# Patient Record
Sex: Female | Born: 1965 | Hispanic: Yes | Smoking: Never smoker
Health system: Southern US, Community
[De-identification: ages and names within clinical notes are randomized; demographics above are authoritative.]

## PROBLEM LIST (undated history)

## (undated) DIAGNOSIS — E119 Type 2 diabetes mellitus without complications: Secondary | ICD-10-CM

## (undated) HISTORY — PX: KNEE SURGERY: SHX244

---

## 2020-07-23 ENCOUNTER — Encounter (HOSPITAL_COMMUNITY): Payer: Self-pay | Admitting: Emergency Medicine

## 2020-07-23 ENCOUNTER — Inpatient Hospital Stay (HOSPITAL_COMMUNITY)
Admission: EM | Admit: 2020-07-23 | Discharge: 2020-07-27 | DRG: 309 | Disposition: A | Discharge status: Home / Self Care | Payer: Self-pay | Attending: Family Medicine | Admitting: Family Medicine

## 2020-07-23 ENCOUNTER — Other Ambulatory Visit: Payer: Self-pay

## 2020-07-23 ENCOUNTER — Emergency Department (HOSPITAL_COMMUNITY): Payer: Self-pay

## 2020-07-23 DIAGNOSIS — R739 Hyperglycemia, unspecified: Secondary | ICD-10-CM

## 2020-07-23 DIAGNOSIS — R55 Syncope and collapse: Secondary | ICD-10-CM | POA: Diagnosis present

## 2020-07-23 DIAGNOSIS — R9431 Abnormal electrocardiogram [ECG] [EKG]: Secondary | ICD-10-CM | POA: Diagnosis present

## 2020-07-23 DIAGNOSIS — F4321 Adjustment disorder with depressed mood: Secondary | ICD-10-CM

## 2020-07-23 DIAGNOSIS — Y92009 Unspecified place in unspecified non-institutional (private) residence as the place of occurrence of the external cause: Secondary | ICD-10-CM

## 2020-07-23 DIAGNOSIS — R7989 Other specified abnormal findings of blood chemistry: Secondary | ICD-10-CM | POA: Diagnosis present

## 2020-07-23 DIAGNOSIS — R031 Nonspecific low blood-pressure reading: Secondary | ICD-10-CM | POA: Diagnosis present

## 2020-07-23 DIAGNOSIS — E881 Lipodystrophy, not elsewhere classified: Secondary | ICD-10-CM | POA: Diagnosis present

## 2020-07-23 DIAGNOSIS — H538 Other visual disturbances: Secondary | ICD-10-CM | POA: Diagnosis present

## 2020-07-23 DIAGNOSIS — I499 Cardiac arrhythmia, unspecified: Principal | ICD-10-CM | POA: Diagnosis present

## 2020-07-23 DIAGNOSIS — R778 Other specified abnormalities of plasma proteins: Secondary | ICD-10-CM | POA: Diagnosis present

## 2020-07-23 DIAGNOSIS — Z8249 Family history of ischemic heart disease and other diseases of the circulatory system: Secondary | ICD-10-CM

## 2020-07-23 DIAGNOSIS — Z681 Body mass index (BMI) 19 or less, adult: Secondary | ICD-10-CM

## 2020-07-23 DIAGNOSIS — T383X6A Underdosing of insulin and oral hypoglycemic [antidiabetic] drugs, initial encounter: Secondary | ICD-10-CM | POA: Diagnosis present

## 2020-07-23 DIAGNOSIS — E1165 Type 2 diabetes mellitus with hyperglycemia: Secondary | ICD-10-CM | POA: Diagnosis present

## 2020-07-23 DIAGNOSIS — Z7984 Long term (current) use of oral hypoglycemic drugs: Secondary | ICD-10-CM

## 2020-07-23 DIAGNOSIS — Z20822 Contact with and (suspected) exposure to covid-19: Secondary | ICD-10-CM | POA: Diagnosis present

## 2020-07-23 DIAGNOSIS — F4320 Adjustment disorder, unspecified: Secondary | ICD-10-CM | POA: Diagnosis present

## 2020-07-23 DIAGNOSIS — Z634 Disappearance and death of family member: Secondary | ICD-10-CM

## 2020-07-23 DIAGNOSIS — R079 Chest pain, unspecified: Secondary | ICD-10-CM | POA: Diagnosis present

## 2020-07-23 DIAGNOSIS — R64 Cachexia: Secondary | ICD-10-CM | POA: Diagnosis present

## 2020-07-23 DIAGNOSIS — Z9119 Patient's noncompliance with other medical treatment and regimen: Secondary | ICD-10-CM

## 2020-07-23 DIAGNOSIS — R54 Age-related physical debility: Secondary | ICD-10-CM | POA: Diagnosis present

## 2020-07-23 HISTORY — DX: Type 2 diabetes mellitus without complications: E11.9

## 2020-07-23 LAB — I-STAT VENOUS BLOOD GAS, ED
Acid-Base Excess: 1 mmol/L (ref 0.0–2.0)
Bicarbonate: 26.7 mmol/L (ref 20.0–28.0)
Calcium, Ion: 1.13 mmol/L — ABNORMAL LOW (ref 1.15–1.40)
HCT: 44 % (ref 36.0–46.0)
Hemoglobin: 15 g/dL (ref 12.0–15.0)
O2 Saturation: 92 %
Potassium: 3.9 mmol/L (ref 3.5–5.1)
Sodium: 135 mmol/L (ref 135–145)
TCO2: 28 mmol/L (ref 22–32)
pCO2, Ven: 46.4 mmHg (ref 44.0–60.0)
pH, Ven: 7.368 (ref 7.250–7.430)
pO2, Ven: 65 mmHg — ABNORMAL HIGH (ref 32.0–45.0)

## 2020-07-23 LAB — CBC WITH DIFFERENTIAL/PLATELET
Abs Immature Granulocytes: 0.02 10*3/uL (ref 0.00–0.07)
Basophils Absolute: 0.1 10*3/uL (ref 0.0–0.1)
Basophils Relative: 1 %
Eosinophils Absolute: 0.1 10*3/uL (ref 0.0–0.5)
Eosinophils Relative: 1 %
HCT: 43.2 % (ref 36.0–46.0)
Hemoglobin: 15.1 g/dL — ABNORMAL HIGH (ref 12.0–15.0)
Immature Granulocytes: 0 %
Lymphocytes Relative: 26 %
Lymphs Abs: 2 10*3/uL (ref 0.7–4.0)
MCH: 30.1 pg (ref 26.0–34.0)
MCHC: 35 g/dL (ref 30.0–36.0)
MCV: 86.2 fL (ref 80.0–100.0)
Monocytes Absolute: 1 10*3/uL (ref 0.1–1.0)
Monocytes Relative: 13 %
Neutro Abs: 4.6 10*3/uL (ref 1.7–7.7)
Neutrophils Relative %: 59 %
Platelets: 232 10*3/uL (ref 150–400)
RBC: 5.01 MIL/uL (ref 3.87–5.11)
RDW: 11.9 % (ref 11.5–15.5)
WBC: 7.8 10*3/uL (ref 4.0–10.5)
nRBC: 0 % (ref 0.0–0.2)

## 2020-07-23 LAB — COMPREHENSIVE METABOLIC PANEL
ALT: 41 U/L (ref 0–44)
AST: 35 U/L (ref 15–41)
Albumin: 3.5 g/dL (ref 3.5–5.0)
Alkaline Phosphatase: 117 U/L (ref 38–126)
Anion gap: 11 (ref 5–15)
BUN: 14 mg/dL (ref 6–20)
CO2: 24 mmol/L (ref 22–32)
Calcium: 8.7 mg/dL — ABNORMAL LOW (ref 8.9–10.3)
Chloride: 99 mmol/L (ref 98–111)
Creatinine, Ser: 0.57 mg/dL (ref 0.44–1.00)
GFR, Estimated: >60 mL/min (ref >60)
Glucose, Bld: 515 mg/dL (ref 70–99)
Potassium: 3.9 mmol/L (ref 3.5–5.1)
Sodium: 134 mmol/L — ABNORMAL LOW (ref 135–145)
Total Bilirubin: 0.5 mg/dL (ref 0.3–1.2)
Total Protein: 6.7 g/dL (ref 6.5–8.1)

## 2020-07-23 LAB — BETA-HYDROXYBUTYRIC ACID: Beta-Hydroxybutyric Acid: 1.62 mmol/L — ABNORMAL HIGH (ref 0.05–0.27)

## 2020-07-23 LAB — TROPONIN I (HIGH SENSITIVITY): Troponin I (High Sensitivity): 193 ng/L (ref <18)

## 2020-07-23 LAB — BRAIN NATRIURETIC PEPTIDE: B Natriuretic Peptide: 85.1 pg/mL (ref 0.0–100.0)

## 2020-07-23 NOTE — ED Provider Notes (Signed)
Emergency Medicine Provider Triage Evaluation Note  Kimberly Fry , a 55 y.o. female  was evaluated in triage.  Pt complains of syncopal episode today with associated chest pain, shortness of breath, headache, and lightheadedness.  Patient is tearful in triage, states that she had a death in the family today and is feeling quite stressed.  CBG for EMS was 573.  BP elevated with 160/98. Hx of diabetes  Review of Systems  Positive: Chest pain,shortness of breath, headache, lower extremity edema Negative: , Chills, nausea, abdominal pain  Physical Exam  There were no vitals taken for this visit. Gen:   Awake, tearful, cachectic Resp:  Normal effort  MSK:   Moves extremities without difficulty  Other:  RRR no M/R/G.  Lungs CTA B  Medical Decision Making  Medically screening exam initiated at 9:15 PM.  Appropriate orders placed.  Kimberly Fry was informed that the remainder of the evaluation will be completed by another provider, this initial triage assessment does not replace that evaluation, and the importance of remaining in the ED until their evaluation is complete.  This chart was dictated using voice recognition software, Dragon. Despite the best efforts of this provider to proofread and correct errors, errors may still occur which can change documentation meaning.    Sherrilee Gilles 07/23/20 2117    Pollyann Savoy, MD 07/23/20 2248

## 2020-07-23 NOTE — ED Triage Notes (Signed)
Pt brought to ED by [facility] from home for c/o syncope episode, HA and dizziness pt had a recent death on her family and going through a lot of stress, CBG for ems 573 pta. Bp 160/98, HR 80, R 20, SPO2 97% RA.

## 2020-07-24 ENCOUNTER — Other Ambulatory Visit: Payer: Self-pay

## 2020-07-24 ENCOUNTER — Encounter (HOSPITAL_COMMUNITY): Payer: Self-pay | Admitting: Internal Medicine

## 2020-07-24 ENCOUNTER — Observation Stay (HOSPITAL_BASED_OUTPATIENT_CLINIC_OR_DEPARTMENT_OTHER): Payer: Self-pay

## 2020-07-24 ENCOUNTER — Observation Stay (HOSPITAL_COMMUNITY): Payer: Self-pay

## 2020-07-24 DIAGNOSIS — R079 Chest pain, unspecified: Secondary | ICD-10-CM

## 2020-07-24 DIAGNOSIS — E1165 Type 2 diabetes mellitus with hyperglycemia: Secondary | ICD-10-CM | POA: Diagnosis present

## 2020-07-24 DIAGNOSIS — R911 Solitary pulmonary nodule: Secondary | ICD-10-CM

## 2020-07-24 DIAGNOSIS — R55 Syncope and collapse: Secondary | ICD-10-CM | POA: Diagnosis present

## 2020-07-24 DIAGNOSIS — R778 Other specified abnormalities of plasma proteins: Secondary | ICD-10-CM | POA: Diagnosis present

## 2020-07-24 LAB — LIPID PANEL
Cholesterol: 180 mg/dL (ref 0–200)
HDL: 43 mg/dL (ref >40)
LDL Cholesterol: 112 mg/dL — ABNORMAL HIGH (ref 0–99)
Total CHOL/HDL Ratio: 4.2 RATIO
Triglycerides: 125 mg/dL (ref <150)
VLDL: 25 mg/dL (ref 0–40)

## 2020-07-24 LAB — CBG MONITORING, ED
Glucose-Capillary: 413 mg/dL — ABNORMAL HIGH (ref 70–99)
Glucose-Capillary: 332 mg/dL — ABNORMAL HIGH (ref 70–99)
Glucose-Capillary: 362 mg/dL — ABNORMAL HIGH (ref 70–99)
Glucose-Capillary: 323 mg/dL — ABNORMAL HIGH (ref 70–99)
Glucose-Capillary: 289 mg/dL — ABNORMAL HIGH (ref 70–99)
Glucose-Capillary: 189 mg/dL — ABNORMAL HIGH (ref 70–99)

## 2020-07-24 LAB — HIV ANTIBODY (ROUTINE TESTING W REFLEX): HIV Screen 4th Generation wRfx: NONREACTIVE

## 2020-07-24 LAB — ECHOCARDIOGRAM COMPLETE
Area-P 1/2: 3.48 cm2
Height: 64 in
S' Lateral: 2.5 cm
Weight: 1440 oz

## 2020-07-24 LAB — BLOOD GAS, VENOUS
Acid-Base Excess: 2.6 mmol/L — ABNORMAL HIGH (ref 0.0–2.0)
Bicarbonate: 26.6 mmol/L (ref 20.0–28.0)
FIO2: 21
O2 Saturation: 71 %
Patient temperature: 36.5
pCO2, Ven: 39.8 mmHg — ABNORMAL LOW (ref 44.0–60.0)
pH, Ven: 7.438 — ABNORMAL HIGH (ref 7.250–7.430)
pO2, Ven: 34.5 mmHg (ref 32.0–45.0)

## 2020-07-24 LAB — SARS CORONAVIRUS 2 (TAT 6-24 HRS): SARS Coronavirus 2: NEGATIVE

## 2020-07-24 LAB — TROPONIN I (HIGH SENSITIVITY)
Troponin I (High Sensitivity): 141 ng/L (ref <18)
Troponin I (High Sensitivity): 65 ng/L — ABNORMAL HIGH (ref <18)

## 2020-07-24 LAB — OSMOLALITY: Osmolality: 301 mOsm/kg — ABNORMAL HIGH (ref 275–295)

## 2020-07-24 LAB — GLUCOSE, CAPILLARY
Glucose-Capillary: 354 mg/dL — ABNORMAL HIGH (ref 70–99)
Glucose-Capillary: 380 mg/dL — ABNORMAL HIGH (ref 70–99)

## 2020-07-24 MED ORDER — ONDANSETRON HCL 4 MG/2ML IJ SOLN: 4.0000 mg | Freq: Four times a day (QID) | INTRAMUSCULAR | Status: DC | PRN

## 2020-07-24 MED ORDER — ACETAMINOPHEN 325 MG PO TABS
650.0000 mg | ORAL_TABLET | ORAL | Status: DC | PRN
  Administered 2020-07-25 – 2020-07-26 (×3): 650 mg via ORAL
  Filled 2020-07-24 (×4): qty 2

## 2020-07-24 MED ORDER — ATORVASTATIN CALCIUM 40 MG PO TABS
40.0000 mg | ORAL_TABLET | Freq: Every day | ORAL | Status: DC
  Administered 2020-07-24 – 2020-07-27 (×4): 40 mg via ORAL
  Filled 2020-07-24 (×4): qty 1

## 2020-07-24 MED ORDER — POLYETHYLENE GLYCOL 3350 17 G PO PACK: 17.0000 g | PACK | Freq: Every day | ORAL | Status: DC | PRN

## 2020-07-24 MED ORDER — INSULIN GLARGINE 100 UNIT/ML ~~LOC~~ SOLN
10.0000 [IU] | Freq: Every day | SUBCUTANEOUS | Status: DC
  Administered 2020-07-24: 10 [IU] via SUBCUTANEOUS
  Filled 2020-07-24 (×2): qty 0.1

## 2020-07-24 MED ORDER — ASPIRIN 325 MG PO TABS
325.0000 mg | ORAL_TABLET | Freq: Every day | ORAL | Status: DC
  Administered 2020-07-24 – 2020-07-27 (×4): 325 mg via ORAL
  Filled 2020-07-24 (×4): qty 1

## 2020-07-24 MED ORDER — ENOXAPARIN SODIUM 40 MG/0.4ML IJ SOSY
40.0000 mg | PREFILLED_SYRINGE | INTRAMUSCULAR | Status: DC
  Administered 2020-07-24 – 2020-07-27 (×4): 40 mg via SUBCUTANEOUS
  Filled 2020-07-24 (×4): qty 0.4

## 2020-07-24 MED ORDER — NITROGLYCERIN 0.4 MG SL SUBL: 0.4000 mg | SUBLINGUAL_TABLET | SUBLINGUAL | Status: DC | PRN

## 2020-07-24 MED ORDER — INSULIN ASPART 100 UNIT/ML IJ SOLN
0.0000 [IU] | Freq: Three times a day (TID) | INTRAMUSCULAR | Status: DC
  Administered 2020-07-24: 11 [IU] via SUBCUTANEOUS
  Administered 2020-07-24 (×2): 15 [IU] via SUBCUTANEOUS
  Administered 2020-07-24: 3 [IU] via SUBCUTANEOUS
  Administered 2020-07-25: 11 [IU] via SUBCUTANEOUS
  Administered 2020-07-25: 15 [IU] via SUBCUTANEOUS
  Administered 2020-07-26: 11 [IU] via SUBCUTANEOUS
  Administered 2020-07-26: 3 [IU] via SUBCUTANEOUS
  Administered 2020-07-26: 8 [IU] via SUBCUTANEOUS
  Administered 2020-07-26 – 2020-07-27 (×2): 11 [IU] via SUBCUTANEOUS

## 2020-07-24 MED ORDER — IOHEXOL 350 MG/ML SOLN
75.0000 mL | Freq: Once | INTRAVENOUS | Status: AC | PRN
  Administered 2020-07-24: 75 mL via INTRAVENOUS

## 2020-07-24 MED ORDER — INSULIN ASPART 100 UNIT/ML IJ SOLN
3.0000 [IU] | Freq: Three times a day (TID) | INTRAMUSCULAR | Status: DC
  Administered 2020-07-24 – 2020-07-27 (×5): 3 [IU] via SUBCUTANEOUS

## 2020-07-24 MED ORDER — SODIUM CHLORIDE 0.9 % IV BOLUS
1000.0000 mL | Freq: Once | INTRAVENOUS | Status: AC
  Administered 2020-07-24: 1000 mL via INTRAVENOUS

## 2020-07-24 NOTE — Progress Notes (Signed)
Inpatient Diabetes Program Recommendations  AACE/ADA: New Consensus Statement on Inpatient Glycemic Control (2015)  Target Ranges:  Prepandial:   less than 140 mg/dL      Peak postprandial:   less than 180 mg/dL (1-2 hours)      Critically ill patients:  140 - 180 mg/dL   Lab Results  Component Value Date   GLUCAP 189 (H) 07/24/2020    Review of Glycemic Control Results for KAISLYN, GULAS (MRN [id number]) as of 07/24/2020 13:30  Ref. Range 07/24/2020 06:19 07/24/2020 07:45 07/24/2020 09:02 07/24/2020 10:28 07/24/2020 11:46  Glucose-Capillary Latest Ref Range: 70 - 99 mg/dL 416 (H) 384 (H) 536 (H) 289 (H) 189 (H)   Diabetes history: DM 2 Outpatient Diabetes medications: Metformin 500 mg bid for 1 year then stopped due to side effects Current orders for Inpatient glycemic control:  Lantus 10 units qhs Novolog 0-15 units tid  Novolog 3 units tid meal coverage  No PCP No insurance  Inpatient Diabetes Program Recommendations:    Spoke with pt at bedside via interpreter Alvino Chapel [phone]). RN staff at bedside placing pt on monitor and assessing pt. Pt reports having a doctor at one time but no longer has one that is affordable. Pt does not work but lives with her daughter. Pt has a meter at home and when she checked her glucose she would be in the 370-400 range. Pt stopped taking metformin after 1 year due to side effects and it was also not effective in lowering her glucose levels. Discussed that pt may need to be on insulin to achieve glucose goals. Lab came to draw blood, pt to order food. Left pt with nursing staff.  Will see pt again on 6/23 to show insulin, possibly 70/30 at time of d/c. Pt has blurry vision currently would benefit from insulin pen at time of d/c. Will follow glucose trends.  TOC consult for PCP and medication assistance needs clinic follow up  Thanks,  Christena Deem RN, MSN, BC-ADM Inpatient Diabetes Coordinator Team Pager [phone] (8a-5p)

## 2020-07-24 NOTE — Consult Note (Signed)
Cardiology Consultation:   Patient ID: Kimberly Fry MRN: [id number]; DOB: [birth year]  Admit date: 07/23/2020 Date of Consult: 07/24/2020  Primary Care Provider: Pcp, No [facility] Cardiologist: None  [facility] Electrophysiologist:  None    Patient Profile:   Kimberly Fry is a 55 y.o. female with a hx of DM2 and medical noncompliance who is being seen today for the evaluation of syncope at the request of Dr. Mahala Menghini.  History of Present Illness:   Ms. Kimberly Fry is a 55yo Spanish speaking female with a hx of DM2 and medical noncompliance.  Medical hx was obtained directly from patient using a translator.  The patient was in her [facility] until yesterday evening when she found out that her mother had died that was sudden after she was thought to be recovering from a respiratory illness.  The patient was devastated and shortly thereafter developed sudden onset of chest pain midsternal pressure with no radiation but associated with SOB but no nausea or diaphoresis. The CP lasted for several hours and suddenly has a witnessed syncopal event by her daughter with no seizure-like activity, no tongue biting or incontinence and quickly regained consciousness.   She was taken to [facility] by EMS where her BS was 573 and BP 160/36mmHg.  Initial hs Trop normal but second one was elevated at 193 and then 141.  Cxray with NAD but a RUL nodule noted and Chest CTA showed a punctate nodule in the RUL with b/l subsegmental atelectasis and no PE or coronary artery calcifications.  EKG showed TWI in V1 and V2 with no prior EKG to compare to. 2D echo is pending.  Cardiology is now asked to consult for workup of CP and syncope.   Past Medical History:  Diagnosis Date   Diabetes mellitus without complication (HCC)     History reviewed. No pertinent surgical history.   Home Medications:  Prior to Admission medications   Not on File    Inpatient Medications: Scheduled Meds:  aspirin   325 mg Oral Daily   atorvastatin  40 mg Oral Daily   enoxaparin (LOVENOX) injection  40 mg Subcutaneous Q24H   insulin aspart  0-15 Units Subcutaneous TID AC & HS   insulin aspart  3 Units Subcutaneous TID WC   insulin glargine  10 Units Subcutaneous QHS   Continuous Infusions:  PRN Meds: acetaminophen, nitroGLYCERIN, ondansetron (ZOFRAN) IV, polyethylene glycol  Allergies:   No Known Allergies  Social History:   Social History   Socioeconomic History   Marital status: Unknown    Spouse name: Not on file   Number of children: Not on file   Years of education: Not on file   Highest education level: Not on file  Occupational History   Not on file  Tobacco Use   Smoking status: Never   Smokeless tobacco: Never  Substance and Sexual Activity   Alcohol use: Never   Drug use: Never   Sexual activity: Never  Other Topics Concern   Not on file  Social History Narrative   Not on file   Social Determinants of Health   Financial Resource Strain: Not on file  Food Insecurity: Not on file  Transportation Needs: Not on file  Physical Activity: Not on file  Stress: Not on file  Social Connections: Not on file  Intimate Partner Violence: Not on file    Family History:    Family History  Problem Relation Age of Onset   Heart disease Father  ROS:  Please see the history of present illness.   All other ROS reviewed and negative.     Physical Exam/Data:   Vitals:   07/24/20 0800 07/24/20 0830 07/24/20 0900 07/24/20 0900  BP: 99/67 93/66 96/78    Pulse: 64 63 63   Resp: 14 13 15    Temp:      TempSrc:      SpO2: 97% 97% 97%   Weight:    40.8 kg  Height:    5\' 4"  (1.626 m)    Intake/Output Summary (Last 24 hours) at 07/24/2020 1106 Last data filed at 07/24/2020 0200 Gross per 24 hour  Intake 1000 ml  Output --  Net 1000 ml   Last 3 Weights 07/24/2020  Weight (lbs) 90 lb  Weight (kg) 40.824 kg     Body mass index is 15.45 kg/m.  General:  Well nourished,  well developed, in no acute distress HEENT: normal Lymph: no adenopathy Neck: no JVD Endocrine:  No thryomegaly Vascular: No carotid bruits; FA pulses 2+ bilaterally without bruits  Cardiac:  normal S1, S2; RRR; no murmur  Lungs:  clear to auscultation bilaterally, no wheezing, rhonchi or rales  Abd: soft, nontender, no hepatomegaly  Ext: no edema Musculoskeletal:  No deformities, BUE and BLE strength normal and equal Skin: warm and dry  Neuro:  CNs 2-12 intact, no focal abnormalities noted Psych:  Normal affect   EKG:  The EKG was personally reviewed and demonstrates:  NSR with TWI in V1 and V2 Telemetry:  Telemetry was personally reviewed and demonstrates:  NSR  Relevant CV Studies: none  Laboratory Data:  High Sensitivity Troponin:   Recent Labs  Lab 07/23/20 2133 07/24/20 0001 07/24/20 0300  TROPONINIHS 193* 141* 65*     Chemistry Recent Labs  Lab 07/23/20 2133 07/23/20 2338  NA 134* 135  K 3.9 3.9  CL 99  --   CO2 24  --   GLUCOSE 515*  --   BUN 14  --   CREATININE 0.57  --   CALCIUM 8.7*  --   GFRNONAA >60  --   ANIONGAP 11  --     Recent Labs  Lab 07/23/20 2133  PROT 6.7  ALBUMIN 3.5  AST 35  ALT 41  ALKPHOS 117  BILITOT 0.5   Hematology Recent Labs  Lab 07/23/20 2133 07/23/20 2338  WBC 7.8  --   RBC 5.01  --   HGB 15.1* 15.0  HCT 43.2 44.0  MCV 86.2  --   MCH 30.1  --   MCHC 35.0  --   RDW 11.9  --   PLT 232  --    BNP Recent Labs  Lab 07/23/20 2133  BNP 85.1    DDimer No results for input(s): DDIMER in the last 168 hours.   Radiology/Studies:  DG Chest 2 View  Result Date: 07/23/2020 CLINICAL DATA:  Syncope. EXAM: CHEST - 2 VIEW COMPARISON:  None. FINDINGS: The heart size and mediastinal contours are within normal limits. Vague nodular density within the right upper lobe measures approximately 1.5 cm and is indeterminate. No pleural effusion, pulmonary edema or airspace consolidation. The visualized skeletal structures are  unremarkable. IMPRESSION: 1. No acute cardiopulmonary abnormalities. 2. Indeterminate nodular density within the right upper lobe. Consider further evaluation with nonemergent CT of the chest. Electronically Signed   By: 07/25/20 M.D.   On: 07/23/2020 21:54   CT Angio Chest PE W and/or Wo Contrast  Result Date: 07/24/2020 CLINICAL DATA:  Pulmonary embolus suspected.  Syncope. EXAM: CT ANGIOGRAPHY CHEST WITH CONTRAST TECHNIQUE: Multidetector CT imaging of the chest was performed using the standard protocol during bolus administration of intravenous contrast. Multiplanar CT image reconstructions and MIPs were obtained to evaluate the vascular anatomy. CONTRAST:  106mL OMNIPAQUE IOHEXOL 350 MG/ML SOLN COMPARISON:  Chest x-ray 07/23/2020 FINDINGS: Cardiovascular: Satisfactory opacification of the pulmonary arteries to the segmental level. No evidence of pulmonary embolism. The main pulmonary artery is normal in caliber. Normal heart size. No significant pericardial effusion. The thoracic aorta is normal in caliber. Trace atherosclerotic plaque of the thoracic aorta. No coronary artery calcifications. Mediastinum/Nodes: No enlarged mediastinal, hilar, or axillary lymph nodes. Thyroid gland, trachea, and esophagus demonstrate no significant findings. Lungs/Pleura: Bilateral lower lobe subsegmental atelectasis. No focal consolidation. Punctate calcified nodule within the left lower lobe. Punctate calcified nodule within the right upper lobe. No pulmonary mass. No pleural effusion. No pneumothorax. Upper Abdomen: No acute abnormality. Musculoskeletal: No chest wall abnormality. No suspicious lytic or blastic osseous lesions. No acute displaced fracture. Multilevel degenerative changes of the spine. Review of the MIP images confirms the above findings. IMPRESSION: 1. No pulmonary embolus. 2. No acute intrathoracic abnormality. Electronically Signed   By: Tish Frederickson M.D.   On: 07/24/2020 06:22     Assessment  and Plan:   Chest pain -this occurred in the setting of learning that her mother had suddenly died -hsTrop mildly elevated at 193>141>65 -no PE or coronary calcifications on Chest CTA -she does have CRFs including DM and fm hx of CAD -her BS was markedly elevated on admission and BP elevated so Trop bump may be related to demand ischemia vs. Stress MI from broken heart syndrome after learning of death of her mother -2D echo pending -coronary CTA in am to rule out CAD  2.  Syncope -? Vasovagal after receiving devastating news vs. orthostatic -orthostatic BPs done with no significant drop from baseline but her baseline Bp is low  -BS was markedly elevated so may have some mild volume depletion -will need 30 day event monitor -2D echo pending -coronary CTA to rule out CAD tomorrow     TIMI Risk Score for Unstable Angina or Non-ST Elevation MI:   The patient's TIMI risk score is 3, which indicates a 13% risk of all cause mortality, new or recurrent myocardial infarction or need for urgent revascularization in the next 14 days.       For questions or updates, please contact [facility] Please consult [email or web address] for contact info under    Signed, Armanda Magic, MD  07/24/2020 11:06 AM

## 2020-07-24 NOTE — Progress Notes (Signed)
I agree with the plan of care as per Dr. Leafy Half  Appreciate cardiology input I went to see the patient but she is fast asleep despite me trying to wake her up x2  Follow echo CTA planned as per cardiology in a.m. Will need to discuss with patient/family glycemic control in the a.m.-agree may need 70/30 insulin and or either SGL PT inhibitor or retrial of metformin/trial of another agent for diabetes control  No charge  Pleas Koch, MD [facility] 3:32 PM

## 2020-07-24 NOTE — ED Provider Notes (Signed)
[facility] EMERGENCY DEPARTMENT Provider Note   CSN: [id number] Arrival date & time: 07/23/20  2118     History Chief Complaint  Patient presents with   Loss of Consciousness   Headache    Kimberly Fry is a 55 y.o. female.  HPI     This a 55 year old female with a history of diabetes who presents with a syncopal episode.  Patient reports that she was informed of her mother's death earlier today.  She subsequently passed out.  She states that she feels bad.  She is complaining now mostly of headache, eye soreness, and chest discomfort.  She denies any shortness of breath.  She is never passed out before.  She was noted to be hyperglycemic by EMS.  She reports that her blood sugars are not generally high.  She does not take anything for her diabetes.  She denies any dizziness at this time.  No recent illnesses or fevers.  Past Medical History:  Diagnosis Date   Diabetes mellitus without complication (HCC)     There are no problems to display for this patient.   History reviewed. No pertinent surgical history.   OB History   No obstetric history on file.     History reviewed. No pertinent family history.  Social History   Tobacco Use   Smoking status: Never   Smokeless tobacco: Never  Substance Use Topics   Alcohol use: Never   Drug use: Never    Home Medications Prior to Admission medications   Not on File    Allergies    Patient has no known allergies.  Review of Systems   Review of Systems  Constitutional:  Negative for fever.  Eyes:  Positive for redness.  Respiratory:  Negative for shortness of breath.   Cardiovascular:  Positive for chest pain.  Gastrointestinal:  Negative for abdominal pain, nausea and vomiting.  Neurological:  Positive for syncope and headaches.  All other systems reviewed and are negative.  Physical Exam Updated Vital Signs BP 94/74   Pulse 65   Temp 98.2 F (36.8 C) (Oral)   Resp 16   SpO2  96%   Physical Exam Vitals and nursing note reviewed.  Constitutional:      Appearance: She is well-developed.     Comments: Appears older than stated age but nontoxic  HENT:     Head: Normocephalic and atraumatic.     Mouth/Throat:     Mouth: Mucous membranes are moist.  Eyes:     Pupils: Pupils are equal, round, and reactive to light.     Comments: Bilateral conjunctiva injected  Cardiovascular:     Rate and Rhythm: Normal rate and regular rhythm.     Heart sounds: Normal heart sounds.  Pulmonary:     Effort: Pulmonary effort is normal. No respiratory distress.     Breath sounds: No wheezing.  Abdominal:     General: Bowel sounds are normal.     Palpations: Abdomen is soft.  Musculoskeletal:     Cervical back: Neck supple.  Skin:    General: Skin is warm and dry.  Neurological:     Mental Status: She is alert and oriented to person, place, and time.     Comments: Cranial nerves II through XII intact, 5 out of 5 strength in all 4 extremities, no dysmetria to finger-nose-finger  Psychiatric:     Comments: Tearful    ED Results / Procedures / Treatments   Labs (all labs ordered  are listed, but only abnormal results are displayed) Labs Reviewed  COMPREHENSIVE METABOLIC PANEL - Abnormal; Notable for the following components:      Result Value   Sodium 134 (*)    Glucose, Bld 515 (*)    Calcium 8.7 (*)    All other components within normal limits  CBC WITH DIFFERENTIAL/PLATELET - Abnormal; Notable for the following components:   Hemoglobin 15.1 (*)    All other components within normal limits  BETA-HYDROXYBUTYRIC ACID - Abnormal; Notable for the following components:   Beta-Hydroxybutyric Acid 1.62 (*)    All other components within normal limits  I-STAT VENOUS BLOOD GAS, ED - Abnormal; Notable for the following components:   pO2, Ven 65.0 (*)    Calcium, Ion 1.13 (*)    All other components within normal limits  CBG MONITORING, ED - Abnormal; Notable for the  following components:   Glucose-Capillary 413 (*)    All other components within normal limits  TROPONIN I (HIGH SENSITIVITY) - Abnormal; Notable for the following components:   Troponin I (High Sensitivity) 193 (*)    All other components within normal limits  TROPONIN I (HIGH SENSITIVITY) - Abnormal; Notable for the following components:   Troponin I (High Sensitivity) 141 (*)    All other components within normal limits  BRAIN NATRIURETIC PEPTIDE  BLOOD GAS, VENOUS    EKG EKG Interpretation  Date/Time:  Wednesday July 24 2020 01:03:56 EDT Ventricular Rate:  63 PR Interval:  135 QRS Duration: 85 QT Interval:  484 QTC Calculation: 496 R Axis:   63 Text Interpretation: Sinus rhythm Borderline prolonged QT interval Confirmed by Ross Marcus ([id number]) on 07/24/2020 1:35:10 AM  Radiology DG Chest 2 View  Result Date: 07/23/2020 CLINICAL DATA:  Syncope. EXAM: CHEST - 2 VIEW COMPARISON:  None. FINDINGS: The heart size and mediastinal contours are within normal limits. Vague nodular density within the right upper lobe measures approximately 1.5 cm and is indeterminate. No pleural effusion, pulmonary edema or airspace consolidation. The visualized skeletal structures are unremarkable. IMPRESSION: 1. No acute cardiopulmonary abnormalities. 2. Indeterminate nodular density within the right upper lobe. Consider further evaluation with nonemergent CT of the chest. Electronically Signed   By: Signa Kell M.D.   On: 07/23/2020 21:54    Procedures Procedures   Medications Ordered in ED Medications  sodium chloride 0.9 % bolus 1,000 mL (1,000 mLs Intravenous New Bag/Given 07/24/20 0107)    ED Course  I have reviewed the triage vital signs and the nursing notes.  Pertinent labs & imaging results that were available during my care of the patient were reviewed by me and considered in my medical decision making (see chart for details).    MDM Rules/Calculators/A&P                           Patient presents with an episode of syncope after finding out that her mother had died.  She reports headache and eye burning right now.  She states when she had the syncopal episode she did have some chest pain or shortness of breath.  She has a history of diabetes which she states she does not take anything for.  She is notably hyperglycemic without evidence of DKA.  She is otherwise nontoxic and vital signs are reassuring.  She is afebrile.  Patient was given fluids.  Labs obtained.  Again no evidence of DKA.  EKG shows no evidence of acute ischemia or arrhythmia.  Chest  x-ray shows an indeterminate nodular density in the right upper lobe.  For this reason, will obtain CT scan.  We will also her stratify for PE.  Patient does have a slightly elevated troponin that is downtrending on second blood draw.  Question Takotsubo's given that her syncopal episode and chest pain occurred in the setting of finding out that her mother died.  Chest x-ray shows a nodule.  Given this, will CT to both evaluate for PE and better characterize nodule.  Plan for admission to the hospitalist.   Final Clinical Impression(s) / ED Diagnoses Final diagnoses:  Syncope, unspecified syncope type  Elevated troponin  Hyperglycemia  Grief reaction    Rx / DC Orders ED Discharge Orders     None        Shon Baton, MD 07/24/20 [phone]

## 2020-07-24 NOTE — Progress Notes (Signed)
  Echocardiogram 2D Echocardiogram has been performed.  Kimberly Fry 07/24/2020, 11:20 AM

## 2020-07-24 NOTE — H&P (Signed)
History and Physical    [address] Kimberly Fry MBT:[id number] DOB: 1965-11-13 DOA: 07/23/2020  PCP: Pcp, No  Patient coming from: Home via EMS   Chief Complaint:  Chief Complaint  Patient presents with   Loss of Consciousness   Headache     HPI:    55 year old Spanish-speaking female with past medical history of diabetes mellitus type 2, medication noncompliance who presents to [facility] emergency department via EMS due to complaints of chest pain and loss of consciousness.  History has been obtained directly from patient using a Nurse, learning disability.  Patient explains that yesterday evening she had learned that her mother had just died.  It seems that her mother was suffering from a respiratory illness and for short while seem that she was improving however she was just contacted via phone and told that her mother had worsened and died yesterday evening.  This was unexpected and quite devastating for the patient.  Patient states that shortly after learning that she began to experience chest discomfort.  Patient describes the chest discomfort as midsternal in location, pressure-like in quality and associated with shortness of breath.  Patient is unable to associate her chest discomfort with exertion.  Patient denies any associated cough.  Patient denies any associated diaphoresis.  Patient is chest discomfort persisted throughout the evening and shortly after her daughter arrived home she suddenly experienced an episode of witnessed loss of consciousness.  No seizure-like activity was observed.  Patient denies urinating or defecating herself or biting her tongue.  Patient quickly regained consciousness and EMS was contacted.  EMS promptly arrived on the scene and noted the patient had a markedly elevated blood sugar of 573 and BP of 160/98.  Patient was then brought into [facility] emergency department for evaluation.  Upon evaluation in the emergency department, initial EKG  was unremarkable but troponin was found to be somewhat elevated at 193 followed by a repeat troponin of 141.  Patient was chest pain-free in the emergency department.  Chest x-ray revealed no evidence of acute cardiopulmonary disease but did identify a right upper lobe pulmonary nodule after which the emergency department provider ordered a CT angiogram of the chest.  The hospitalist group was then called to assess the patient for admission to the hospital.  Review of Systems:   Review of Systems  Respiratory:  Positive for shortness of breath.   Cardiovascular:  Positive for chest pain.  Neurological:  Positive for loss of consciousness.  All other systems reviewed and are negative.  Past Medical History:  Diagnosis Date   Diabetes mellitus without complication (HCC)     History reviewed. No pertinent surgical history.   reports that she has never smoked. She has never used smokeless tobacco. She reports that she does not drink alcohol and does not use drugs.  No Known Allergies  Family History  Problem Relation Age of Onset   Heart disease Father      Prior to Admission medications   Not on File    Physical Exam: Vitals:   07/24/20 0145 07/24/20 0345 07/24/20 0405 07/24/20 0600  BP: 100/71 95/69 102/75 95/68  Pulse: 64 64 64 68  Resp: 14 15 16 15   Temp:      TempSrc:      SpO2: 97% 97% 98% 97%    Constitutional: Awake alert and oriented x3, no associated distress.   Skin: no rashes, no lesions, good skin turgor noted. Eyes: Pupils are equally reactive to light.  No  evidence of scleral icterus or conjunctival pallor.  ENMT: Moist mucous membranes noted.  Posterior pharynx clear of any exudate or lesions.   Neck: normal, supple, no masses, no thyromegaly.  No evidence of jugular venous distension.   Respiratory: clear to auscultation bilaterally, no wheezing, no crackles. Normal respiratory effort. No accessory muscle use.  Cardiovascular: Regular rate and rhythm, no  murmurs / rubs / gallops. No extremity edema. 2+ pedal pulses. No carotid bruits.  Chest:   Notable reproducible tenderness of the anterior chest without crepitus or deformity.   Back:   Nontender without crepitus or deformity. Abdomen: Abdomen is soft and nontender.  No evidence of intra-abdominal masses.  Positive bowel sounds noted in all quadrants.   Musculoskeletal: No joint deformity upper and lower extremities. Good ROM, no contractures. Normal muscle tone.  Neurologic: CN 2-12 grossly intact. Sensation intact.  Patient moving all 4 extremities spontaneously.  Patient is following all commands.  Patient is responsive to verbal stimuli.   Psychiatric: Patient exhibits extremely depressed with appropriate affect.  Patient seems to possess insight as to their current situation.     Labs on Admission: I have personally reviewed following labs and imaging studies -   CBC: Recent Labs  Lab 07/23/20 2133 07/23/20 2338  WBC 7.8  --   NEUTROABS 4.6  --   HGB 15.1* 15.0  HCT 43.2 44.0  MCV 86.2  --   PLT 232  --    Basic Metabolic Panel: Recent Labs  Lab 07/23/20 2133 07/23/20 2338  NA 134* 135  K 3.9 3.9  CL 99  --   CO2 24  --   GLUCOSE 515*  --   BUN 14  --   CREATININE 0.57  --   CALCIUM 8.7*  --    GFR: CrCl cannot be calculated (Unknown ideal weight.). Liver Function Tests: Recent Labs  Lab 07/23/20 2133  AST 35  ALT 41  ALKPHOS 117  BILITOT 0.5  PROT 6.7  ALBUMIN 3.5   No results for input(s): LIPASE, AMYLASE in the last 168 hours. No results for input(s): AMMONIA in the last 168 hours. Coagulation Profile: No results for input(s): INR, PROTIME in the last 168 hours. Cardiac Enzymes: No results for input(s): CKTOTAL, CKMB, CKMBINDEX, TROPONINI in the last 168 hours. BNP (last 3 results) No results for input(s): PROBNP in the last 8760 hours. HbA1C: No results for input(s): HGBA1C in the last 72 hours. CBG: Recent Labs  Lab 07/24/20 0101  07/24/20 0619  GLUCAP 413* 332*   Lipid Profile: Recent Labs    07/24/20 0300  CHOL 180  HDL 43  LDLCALC 112*  TRIG 125  CHOLHDL 4.2   Thyroid Function Tests: No results for input(s): TSH, T4TOTAL, FREET4, T3FREE, THYROIDAB in the last 72 hours. Anemia Panel: No results for input(s): VITAMINB12, FOLATE, FERRITIN, TIBC, IRON, RETICCTPCT in the last 72 hours. Urine analysis: No results found for: COLORURINE, APPEARANCEUR, LABSPEC, PHURINE, GLUCOSEU, HGBUR, BILIRUBINUR, KETONESUR, PROTEINUR, UROBILINOGEN, NITRITE, LEUKOCYTESUR  Radiological Exams on Admission - Personally Reviewed: DG Chest 2 View  Result Date: 07/23/2020 CLINICAL DATA:  Syncope. EXAM: CHEST - 2 VIEW COMPARISON:  None. FINDINGS: The heart size and mediastinal contours are within normal limits. Vague nodular density within the right upper lobe measures approximately 1.5 cm and is indeterminate. No pleural effusion, pulmonary edema or airspace consolidation. The visualized skeletal structures are unremarkable. IMPRESSION: 1. No acute cardiopulmonary abnormalities. 2. Indeterminate nodular density within the right upper lobe. Consider further evaluation with  nonemergent CT of the chest. Electronically Signed   By: Signa Kell M.D.   On: 07/23/2020 21:54   CT Angio Chest PE W and/or Wo Contrast  Result Date: 07/24/2020 CLINICAL DATA:  Pulmonary embolus suspected.  Syncope. EXAM: CT ANGIOGRAPHY CHEST WITH CONTRAST TECHNIQUE: Multidetector CT imaging of the chest was performed using the standard protocol during bolus administration of intravenous contrast. Multiplanar CT image reconstructions and MIPs were obtained to evaluate the vascular anatomy. CONTRAST:  70mL OMNIPAQUE IOHEXOL 350 MG/ML SOLN COMPARISON:  Chest x-ray 07/23/2020 FINDINGS: Cardiovascular: Satisfactory opacification of the pulmonary arteries to the segmental level. No evidence of pulmonary embolism. The main pulmonary artery is normal in caliber. Normal heart  size. No significant pericardial effusion. The thoracic aorta is normal in caliber. Trace atherosclerotic plaque of the thoracic aorta. No coronary artery calcifications. Mediastinum/Nodes: No enlarged mediastinal, hilar, or axillary lymph nodes. Thyroid gland, trachea, and esophagus demonstrate no significant findings. Lungs/Pleura: Bilateral lower lobe subsegmental atelectasis. No focal consolidation. Punctate calcified nodule within the left lower lobe. Punctate calcified nodule within the right upper lobe. No pulmonary mass. No pleural effusion. No pneumothorax. Upper Abdomen: No acute abnormality. Musculoskeletal: No chest wall abnormality. No suspicious lytic or blastic osseous lesions. No acute displaced fracture. Multilevel degenerative changes of the spine. Review of the MIP images confirms the above findings. IMPRESSION: 1. No pulmonary embolus. 2. No acute intrathoracic abnormality. Electronically Signed   By: Tish Frederickson M.D.   On: 07/24/2020 06:22    EKG: Personally reviewed.  Rhythm is normal sinus rhythm with heart rate of 63 bpm.  No dynamic ST segment changes appreciated.  Assessment/Plan Principal Problem:   Chest pain with elevated troponin Patient presenting with sudden onset chest discomfort almost immediately after learning that her mother passed away yesterday. Patient is currently distraught over her mother's death and is possibly the etiology for her chest discomfort Troponin found to be 193 initially but is quickly downtrending with a second troponin being 141.  This downtrending troponin makes plaque rupture extremely unlikely EKG reveals no dynamic change of the ST segments Presentation in the setting of extreme stress is concerning for possible Takotsubo's. Echocardiogram ordered for the morning Monitoring patient on telemetry Formal cardiology consultation will be called in the morning N.p.o. after midnight in case ischemic assessment is pursued Chest x-ray  unremarkable. CT angiogram of the chest reveals no evidence of pulmonary embolism.  Active Problems:   Syncope Syncope in the setting of extreme stress is concerning for possible vasovagal syncope Monitoring patient on telemetry Ordering orthostatic vital signs Echocardiogram in the morning    Type 2 diabetes mellitus with hyperglycemia, without long-term current use of insulin (HCC)  Longstanding history of diabetes and currently not on medical therapy Patient extremely hyperglycemic on arrival but no evidence of anion gap or substantially elevated serum osmolality to suggest DKA or hyperosmolar nonketotic state. Hydrating patient aggressively with intravenous fluids Accu-Cheks before every meal and nightly with sliding scale insulin Awaiting hemoglobin A1c Will initiate patient on low-dose regimen of basal bolus insulin therapy which can be titrated upwards as necessary to achieve excellent glycemic control.    Code Status:  Full code Family Communication: Daughter is at bedside who has been updated on plan of care.  Status is: Observation  The patient remains OBS appropriate and will d/c before 2 midnights.  Dispo: The patient is from: Home              Anticipated d/c is to: Home  Patient currently is not medically stable to d/c.   Difficult to place patient No        Marinda ElkGeorge J Azie Mcconahy MD [facility] Pager [phone]- [phone]  If 7PM-7AM, please contact night-coverage [email or web address] Use universal [place] password for that web site. If you do not have the password, please call the hospital operator.  07/24/2020, 6:54 AM

## 2020-07-24 NOTE — ED Notes (Signed)
Patient states she feels dizzy and weak upon sitting and standing for orthostatic vitals

## 2020-07-25 ENCOUNTER — Observation Stay (HOSPITAL_COMMUNITY): Payer: Self-pay

## 2020-07-25 DIAGNOSIS — R55 Syncope and collapse: Secondary | ICD-10-CM

## 2020-07-25 DIAGNOSIS — R0789 Other chest pain: Secondary | ICD-10-CM

## 2020-07-25 DIAGNOSIS — R9431 Abnormal electrocardiogram [ECG] [EKG]: Secondary | ICD-10-CM | POA: Diagnosis present

## 2020-07-25 DIAGNOSIS — E1165 Type 2 diabetes mellitus with hyperglycemia: Secondary | ICD-10-CM

## 2020-07-25 DIAGNOSIS — R079 Chest pain, unspecified: Secondary | ICD-10-CM

## 2020-07-25 LAB — COMPREHENSIVE METABOLIC PANEL
ALT: 30 U/L (ref 0–44)
AST: 27 U/L (ref 15–41)
Albumin: 2.8 g/dL — ABNORMAL LOW (ref 3.5–5.0)
Alkaline Phosphatase: 80 U/L (ref 38–126)
Anion gap: 7 (ref 5–15)
BUN: 16 mg/dL (ref 6–20)
CO2: 25 mmol/L (ref 22–32)
Calcium: 8.8 mg/dL — ABNORMAL LOW (ref 8.9–10.3)
Chloride: 103 mmol/L (ref 98–111)
Creatinine, Ser: 0.5 mg/dL (ref 0.44–1.00)
GFR, Estimated: >60 mL/min (ref >60)
Glucose, Bld: 296 mg/dL — ABNORMAL HIGH (ref 70–99)
Potassium: 3.7 mmol/L (ref 3.5–5.1)
Sodium: 135 mmol/L (ref 135–145)
Total Bilirubin: 0.8 mg/dL (ref 0.3–1.2)
Total Protein: 5.8 g/dL — ABNORMAL LOW (ref 6.5–8.1)

## 2020-07-25 LAB — GLUCOSE, CAPILLARY
Glucose-Capillary: 197 mg/dL — ABNORMAL HIGH (ref 70–99)
Glucose-Capillary: 310 mg/dL — ABNORMAL HIGH (ref 70–99)
Glucose-Capillary: 122 mg/dL — ABNORMAL HIGH (ref 70–99)
Glucose-Capillary: 255 mg/dL — ABNORMAL HIGH (ref 70–99)
Glucose-Capillary: 411 mg/dL — ABNORMAL HIGH (ref 70–99)

## 2020-07-25 LAB — CORTISOL-AM, BLOOD: Cortisol - AM: 11.7 ug/dL (ref 6.7–22.6)

## 2020-07-25 LAB — HEMOGLOBIN A1C
Hgb A1c MFr Bld: >15.5 % — ABNORMAL HIGH (ref 4.8–5.6)
Mean Plasma Glucose: >398 mg/dL

## 2020-07-25 LAB — MAGNESIUM: Magnesium: 1.7 mg/dL (ref 1.7–2.4)

## 2020-07-25 MED ORDER — GADOBUTROL 1 MMOL/ML IV SOLN
4.0000 mL | Freq: Once | INTRAVENOUS | Status: AC | PRN
  Administered 2020-07-25: 4 mL via INTRAVENOUS

## 2020-07-25 MED ORDER — POTASSIUM CHLORIDE CRYS ER 20 MEQ PO TBCR
40.0000 meq | EXTENDED_RELEASE_TABLET | Freq: Once | ORAL | Status: AC
  Administered 2020-07-25: 40 meq via ORAL
  Filled 2020-07-25: qty 2

## 2020-07-25 MED ORDER — INSULIN ASPART 100 UNIT/ML IJ SOLN
3.0000 [IU] | Freq: Once | INTRAMUSCULAR | Status: AC
  Administered 2020-07-25: 3 [IU] via SUBCUTANEOUS

## 2020-07-25 MED ORDER — COSYNTROPIN 0.25 MG IJ SOLR
0.2500 mg | Freq: Once | INTRAMUSCULAR | Status: AC
  Administered 2020-07-26: 0.25 mg via INTRAVENOUS
  Filled 2020-07-25 (×2): qty 0.25

## 2020-07-25 MED ORDER — IVABRADINE HCL 7.5 MG PO TABS
7.5000 mg | ORAL_TABLET | Freq: Once | ORAL | Status: DC | PRN
  Filled 2020-07-25: qty 1

## 2020-07-25 MED ORDER — INSULIN ASPART PROT & ASPART (70-30 MIX) 100 UNIT/ML ~~LOC~~ SUSP
8.0000 [IU] | Freq: Two times a day (BID) | SUBCUTANEOUS | Status: DC
  Administered 2020-07-25: 8 [IU] via SUBCUTANEOUS
  Filled 2020-07-25: qty 10

## 2020-07-25 MED ORDER — GLIMEPIRIDE 1 MG PO TABS
1.0000 mg | ORAL_TABLET | Freq: Every day | ORAL | Status: DC
  Administered 2020-07-26 – 2020-07-27 (×2): 1 mg via ORAL
  Filled 2020-07-25 (×3): qty 1

## 2020-07-25 MED ORDER — INSULIN GLARGINE 100 UNIT/ML ~~LOC~~ SOLN
12.0000 [IU] | Freq: Every day | SUBCUTANEOUS | Status: DC
  Filled 2020-07-25: qty 0.12

## 2020-07-25 MED ORDER — MAGNESIUM SULFATE 2 GM/50ML IV SOLN
2.0000 g | Freq: Once | INTRAVENOUS | Status: AC
  Administered 2020-07-25: 2 g via INTRAVENOUS
  Filled 2020-07-25: qty 50

## 2020-07-25 MED ORDER — NITROGLYCERIN 0.4 MG SL SUBL
SUBLINGUAL_TABLET | SUBLINGUAL | Status: AC
  Filled 2020-07-25: qty 2

## 2020-07-25 MED ORDER — INSULIN STARTER KIT- PEN NEEDLES (SPANISH)
1.0000 | Freq: Once | Status: AC
  Administered 2020-07-25: 1
  Filled 2020-07-25 (×2): qty 1

## 2020-07-25 NOTE — Progress Notes (Signed)
   07/25/20 1500  Vitals  Temp 98.6 F (37 C)  Temp Source Oral  BP 102/62  MAP (mmHg) 75  BP Location Right Arm  BP Method Automatic  Patient Position (if appropriate) Lying  Pulse Rate 60  Pulse Rate Source Monitor  ECG Heart Rate 61  Resp 16  MEWS COLOR  MEWS Score Color Green  Orthostatic Lying   BP- Lying 102/62  Pulse- Lying 61  Orthostatic Sitting  BP- Sitting (!) 82/58  Pulse- Sitting 69  Orthostatic Standing at 0 minutes  BP- Standing at 0 minutes 93/67  Pulse- Standing at 0 minutes 82  Orthostatic Standing at 3 minutes  BP- Standing at 3 minutes 92/66  Pulse- Standing at 3 minutes 70

## 2020-07-25 NOTE — Progress Notes (Signed)
PROGRESS NOTE   Kimberly Fry  ZOX:[id number] DOB: Jun 15, 1965 DOA: 07/23/2020 PCP: Pcp, No  Brief Narrative:  [name] DM TY 2 noncompliant on metformin BMI 15 Admit 6/22 from home after syncope, collapse, chest pain in the setting of recent family death (lost her mother suddenly) Troponins elevated peak around 200 with downtrending pattern Cardiology consulted    Hospital-Problem based course  Syncope collapse Orthostatics are [+] Echocardiogram shows no significant valvular issue ?  Adrenal insufficiency (would expect hyponatremia in 60 to 70% of cases, hypokalemia in 30% of cases )--a.m. cortisol at 10/17/2009 0.7 is equivocal Get short ACTH test to clarify Elevated troponin Prolonged QtC CT coronary suboptimal however discussed personally with Dr. Senaida Oresurner-no plan for ischemic work-up QTC prolonged at this afternoon and cardiology is talking with EP with regards to next steps Uncontrolled diabetes mellitus-A1c p >15 BMI 15  Start Amaryl 1 mg breakfast switch Lantus to 70/30 insulin 8 units twice daily and adjust accordingly Her weight loss of about 30 pounds over the past year coincides with her diagnosis of diabetes without any control She will need significant education prior to discharge-I have asked that her daughter present herself so that daughter can listen and and learn how to help patient manage her disease process Appreciate diabetic coordinator input  DVT prophylaxis: Lovenox Code Status: Full Family Communication: No family present at bedside Disposition:  Status is: Observation  The patient will require care spanning > 2 midnights and should be moved to inpatient because: Hemodynamically unstable, Persistent severe electrolyte disturbances, and Unsafe d/c plan  Dispo: The patient is from: Home              Anticipated d/c is to: Home              Patient currently is not medically stable to d/c.    Difficult to place patient Yes       Consultants:  Cardiology Electrophysiology echocardiogram CT  Procedures:   Echocardiogram 6/22 CT angio 6/23  Antimicrobials:  None   Subjective: States she continues to have some dizziness no real headache No current chest pain Slightly depressed I was informed by nursing staff subsequent to my seeing her that QTC significantly prolonged and cardiology reevaluated patient  Objective: Vitals:   07/24/20 1257 07/24/20 2049 07/24/20 2303 07/25/20 0333  BP: 94/68 94/64 95/62  99/66  Pulse: 68 72 61 (!) 53  Resp: 16 16 15 16   Temp:   98.4 F (36.9 C) 97.6 F (36.4 C)  TempSrc:  Oral Oral Oral  SpO2: 98% 95% 97% 98%  Weight:      Height:        Intake/Output Summary (Last 24 hours) at 07/25/2020 [id number] Last data filed at 07/24/2020 2000 Gross per 24 hour  Intake 480 ml  Output --  Net 480 ml   Filed Weights   07/24/20 0900  Weight: 40.8 kg    Examination:  Asthenic Hispanic female no distress bitemporal wasting no icterus no pallor Chest clear No JVD Monitors when I saw her showed QTC about 0.49 and predominant sinus rhythm  abdomen soft scaphoid   Data Reviewed: personally reviewed   CBC    Component Value Date/Time   WBC 7.8 07/23/2020 2133   RBC 5.01 07/23/2020 2133   HGB 15.0 07/23/2020 2338   HCT 44.0 07/23/2020 2338   PLT 232 07/23/2020 2133   MCV 86.2 07/23/2020 2133   MCH 30.1 07/23/2020 2133   MCHC 35.0 07/23/2020  2133   RDW 11.9 07/23/2020 2133   LYMPHSABS 2.0 07/23/2020 2133   MONOABS 1.0 07/23/2020 2133   EOSABS 0.1 07/23/2020 2133   BASOSABS 0.1 07/23/2020 2133   CMP Latest Ref Rng & Units 07/23/2020 07/23/2020  Glucose 70 - 99 mg/dL - 696(EX)  BUN 6 - 20 mg/dL - 14  Creatinine 5.28 - 1.00 mg/dL - 4.13  Sodium 244 - 010 mmol/L 135 134(L)  Potassium 3.5 - 5.1 mmol/L 3.9 3.9  Chloride 98 - 111 mmol/L - 99  CO2 22 - 32 mmol/L - 24  Calcium 8.9 - 10.3 mg/dL - 8.7(L)  Total Protein 6.5 - 8.1  g/dL - 6.7  Total Bilirubin 0.3 - 1.2 mg/dL - 0.5  Alkaline Phos 38 - 126 U/L - 117  AST 15 - 41 U/L - 35  ALT 0 - 44 U/L - 41     Radiology Studies: DG Chest 2 View  Result Date: 07/23/2020 CLINICAL DATA:  Syncope. EXAM: CHEST - 2 VIEW COMPARISON:  None. FINDINGS: The heart size and mediastinal contours are within normal limits. Vague nodular density within the right upper lobe measures approximately 1.5 cm and is indeterminate. No pleural effusion, pulmonary edema or airspace consolidation. The visualized skeletal structures are unremarkable. IMPRESSION: 1. No acute cardiopulmonary abnormalities. 2. Indeterminate nodular density within the right upper lobe. Consider further evaluation with nonemergent CT of the chest. Electronically Signed   By: Signa Kell M.D.   On: 07/23/2020 21:54   CT Angio Chest PE W and/or Wo Contrast  Result Date: 07/24/2020 CLINICAL DATA:  Pulmonary embolus suspected.  Syncope. EXAM: CT ANGIOGRAPHY CHEST WITH CONTRAST TECHNIQUE: Multidetector CT imaging of the chest was performed using the standard protocol during bolus administration of intravenous contrast. Multiplanar CT image reconstructions and MIPs were obtained to evaluate the vascular anatomy. CONTRAST:  38mL OMNIPAQUE IOHEXOL 350 MG/ML SOLN COMPARISON:  Chest x-ray 07/23/2020 FINDINGS: Cardiovascular: Satisfactory opacification of the pulmonary arteries to the segmental level. No evidence of pulmonary embolism. The main pulmonary artery is normal in caliber. Normal heart size. No significant pericardial effusion. The thoracic aorta is normal in caliber. Trace atherosclerotic plaque of the thoracic aorta. No coronary artery calcifications. Mediastinum/Nodes: No enlarged mediastinal, hilar, or axillary lymph nodes. Thyroid gland, trachea, and esophagus demonstrate no significant findings. Lungs/Pleura: Bilateral lower lobe subsegmental atelectasis. No focal consolidation. Punctate calcified nodule within the left  lower lobe. Punctate calcified nodule within the right upper lobe. No pulmonary mass. No pleural effusion. No pneumothorax. Upper Abdomen: No acute abnormality. Musculoskeletal: No chest wall abnormality. No suspicious lytic or blastic osseous lesions. No acute displaced fracture. Multilevel degenerative changes of the spine. Review of the MIP images confirms the above findings. IMPRESSION: 1. No pulmonary embolus. 2. No acute intrathoracic abnormality. Electronically Signed   By: Tish Frederickson M.D.   On: 07/24/2020 06:22   ECHOCARDIOGRAM COMPLETE  Result Date: 07/24/2020    ECHOCARDIOGRAM REPORT   Patient Name:   Kimberly Fry Date of Exam: 07/24/2020 Medical Rec #:  [id number]               Height: Accession #:    [id number]              Weight: Date of Birth:  1965-09-13               BSA: Patient Age:    55 years                BP:  79/37 mmHg Patient Gender: F                       HR:           69 bpm. Exam Location:  Inpatient Procedure: 2D Echo Indications:    R07.9* Chest pain, unspecified  History:        Patient has no prior history of Echocardiogram examinations.                 Risk Factors:Diabetes.  Sonographer:    Elmarie Shiley Dance Referring Phys: [id number] Deno Lunger [place] IMPRESSIONS  1. Left ventricular ejection fraction, by estimation, is 60 to 65%. The left ventricle has normal function. The left ventricle has no regional wall motion abnormalities. Left ventricular diastolic parameters are consistent with Grade I diastolic dysfunction (impaired relaxation).  2. Right ventricular systolic function is normal. The right ventricular size is normal. There is normal pulmonary artery systolic pressure.  3. The mitral valve is normal in structure. No evidence of mitral valve regurgitation. No evidence of mitral stenosis.  4. The aortic valve is tricuspid. Aortic valve regurgitation is not visualized. No aortic stenosis is present.  5. The inferior vena cava is normal in size with  greater than 50% respiratory variability, suggesting right atrial pressure of 3 mmHg. FINDINGS  Left Ventricle: Left ventricular ejection fraction, by estimation, is 60 to 65%. The left ventricle has normal function. The left ventricle has no regional wall motion abnormalities. The left ventricular internal cavity size was normal in size. There is  no left ventricular hypertrophy. Left ventricular diastolic parameters are consistent with Grade I diastolic dysfunction (impaired relaxation). Normal left ventricular filling pressure. Right Ventricle: The right ventricular size is normal. No increase in right ventricular wall thickness. Right ventricular systolic function is normal. There is normal pulmonary artery systolic pressure. The tricuspid regurgitant velocity is 1.63 m/s, and  with an assumed right atrial pressure of 3 mmHg, the estimated right ventricular systolic pressure is 13.6 mmHg. Left Atrium: Left atrial size was normal in size. Right Atrium: Right atrial size was normal in size. Pericardium: There is no evidence of pericardial effusion. Mitral Valve: The mitral valve is normal in structure. No evidence of mitral valve regurgitation. No evidence of mitral valve stenosis. Tricuspid Valve: The tricuspid valve is normal in structure. Tricuspid valve regurgitation is trivial. No evidence of tricuspid stenosis. Aortic Valve: The aortic valve is tricuspid. Aortic valve regurgitation is not visualized. No aortic stenosis is present. Pulmonic Valve: The pulmonic valve was normal in structure. Pulmonic valve regurgitation is not visualized. No evidence of pulmonic stenosis. Aorta: The aortic root is normal in size and structure. Venous: The inferior vena cava is normal in size with greater than 50% respiratory variability, suggesting right atrial pressure of 3 mmHg. IAS/Shunts: No atrial level shunt detected by color flow Doppler.  LEFT VENTRICLE PLAX 2D LVIDd:         4.30 cm  Diastology LVIDs:         2.50 cm   LV e' medial:    5.47 cm/s LV PW:         0.80 cm  LV E/e' medial:  8.2 LV IVS:        0.60 cm  LV e' lateral:   8.08 cm/s LVOT diam:     1.70 cm  LV E/e' lateral: 5.6 LV SV:         36 LVOT Area:     2.27 cm  RIGHT VENTRICLE            IVC RV Basal diam:  2.30 cm    IVC diam: 1.10 cm RV S prime:     9.32 cm/s TAPSE (M-mode): 1.6 cm LEFT ATRIUM             RIGHT ATRIUM LA diam:        3.00 cm RA Area:     7.15 cm LA Vol (A2C):   27.7 ml RA Volume:   11.00 ml LA Vol (A4C):   19.2 ml LA Biplane Vol: 24.5 ml  AORTIC VALVE LVOT Vmax:   91.90 cm/s LVOT Vmean:  59.600 cm/s LVOT VTI:    0.157 m  AORTA Ao Root diam: 3.10 cm Ao Asc diam:  3.30 cm MITRAL VALVE               TRICUSPID VALVE MV Area (PHT): 3.48 cm    TR Peak grad:   10.6 mmHg MV Decel Time: 218 msec    TR Vmax:        163.00 cm/s MV E velocity: 44.90 cm/s MV A velocity: 74.40 cm/s  SHUNTS MV E/A ratio:  0.60        Systemic VTI:  0.16 m                            Systemic Diam: 1.70 cm Chilton Si MD Electronically signed by Chilton Si MD Signature Date/Time: 07/24/2020/4:21:57 PM    Final      Scheduled Meds:  aspirin  325 mg Oral Daily   atorvastatin  40 mg Oral Daily   enoxaparin (LOVENOX) injection  40 mg Subcutaneous Q24H   insulin aspart  0-15 Units Subcutaneous TID AC & HS   insulin aspart  3 Units Subcutaneous TID WC   insulin glargine  10 Units Subcutaneous QHS   Continuous Infusions:   LOS: 0 days   Time spent: 76  Rhetta Mura, MD [facility] To contact the attending provider between 7A-7P or the covering provider during after hours 7P-7A, please log into the web site [email or web address] and access using universal [place] password for that web site. If you do not have the password, please call the hospital operator.  07/25/2020, 7:33 AM

## 2020-07-25 NOTE — Progress Notes (Signed)
Inpatient Diabetes Program Recommendations  AACE/ADA: New Consensus Statement on Inpatient Glycemic Control (2015)  Target Ranges:  Prepandial:   less than 140 mg/dL      Peak postprandial:   less than 180 mg/dL (1-2 hours)      Critically ill patients:  140 - 180 mg/dL   Lab Results  Component Value Date   GLUCAP 122 (H) 07/25/2020   HGBA1C >15.5 (H) 07/24/2020    Review of Glycemic Control  Diabetes history: DM2 Outpatient Diabetes medications: Metformin 500 mg BID x 1 year, then stopped d/t side effects Current orders for Inpatient glycemic control: Novolog 70/30 8 units BID, Novolog 0-15 units TID with meals and 0-5 HS + 3 units TID, Amaryl 1 mg with breakfast  HgbA1C - > 15.5% No insurance, no PCP Pt states she has a glucose meter at home  Inpatient Diabetes Program Recommendations:    D/C Novolog 3 units TID with meals (has meal coverage in 70/30 insulin) Add CHO mod to heart healthy diet TOC consult for PCP and medication assistance Ordered insulin pen starter kit in Spanish  Educated patient on insulin pen use at home. Reviewed all steps if insulin pen including attachment of needle, 2-unit air shot, dialing up dose, giving injection, removing needle, disposal of sharps, storage of unused insulin, disposal of insulin etc. Patient was unable to dial up dose because she couldn't see the numbers on the pen. Pt states daughter will be the one to dial up dose of insulin and she will then give it to herself.  Also reviewed troubleshooting with insulin pen. MD to give patient Rxs for insulin pens and insulin pen needles.   Also reviewed hypoglycemia s/s and treatment. Pt voices understanding.  Follow closely and titrate 70/30 if needed. RN to allow pt to stick finger for CBGs and give her own insulin while inpatient.   Thank you. Lorenda Peck, RD, LDN, CDE Inpatient Diabetes Coordinator [phone]

## 2020-07-25 NOTE — Progress Notes (Signed)
Pt CBG 411- paged [facility] call with this information- will await orders

## 2020-07-25 NOTE — Progress Notes (Addendum)
Progress Note  Patient Name: Kimberly Fry Date of Encounter: 07/25/2020  [facility] Cardiologist: Dr. Mayford Knife  Subjective   No acute overnight events. No chest pain. No shortness of breath. No recurrent syncope. Her only complaint this morning is pressure in her head. She states it feels like a "storm in her head."  Inpatient Medications    Scheduled Meds:  aspirin  325 mg Oral Daily   atorvastatin  40 mg Oral Daily   enoxaparin (LOVENOX) injection  40 mg Subcutaneous Q24H   insulin aspart  0-15 Units Subcutaneous TID AC & HS   insulin aspart  3 Units Subcutaneous TID WC   insulin glargine  10 Units Subcutaneous QHS   Continuous Infusions:  PRN Meds: acetaminophen, nitroGLYCERIN, ondansetron (ZOFRAN) IV, polyethylene glycol   Vital Signs    Vitals:   07/24/20 1257 07/24/20 2049 07/24/20 2303 07/25/20 0333  BP: 94/68 94/64 95/62  99/66  Pulse: 68 72 61 (!) 53  Resp: 16 16 15 16   Temp:   98.4 F (36.9 C) 97.6 F (36.4 C)  TempSrc:  Oral Oral Oral  SpO2: 98% 95% 97% 98%  Weight:      Height:        Intake/Output Summary (Last 24 hours) at 07/25/2020 0649 Last data filed at 07/24/2020 2000 Gross per 24 hour  Intake 480 ml  Output --  Net 480 ml   Last 3 Weights 07/24/2020  Weight (lbs) 90 lb  Weight (kg) 40.824 kg      Telemetry    Sins rhythm with rates in the 50s to 70s. - Personally Reviewed  ECG    No new ECG tracing today. - Personally Reviewed  Physical Exam   GEN: No acute distress.   Neck: No JVD. Cardiac: RRR. No murmurs, rubs, or gallops.  Respiratory: Clear to auscultation bilaterally. No wheezes, rhonchi, or rales. GI: Soft, non-distended, and non-tender. MS: No lower extremity edema. No deformity. Skin: Warm and dry. Neuro:  No focal deficits. Psych: Normal affect. Responds appropriately.  Labs    High Sensitivity Troponin:   Recent Labs  Lab 07/23/20 2133 07/24/20 0001 07/24/20 0300  TROPONINIHS 193* 141* 65*       Chemistry Recent Labs  Lab 07/23/20 2133 07/23/20 2338  NA 134* 135  K 3.9 3.9  CL 99  --   CO2 24  --   GLUCOSE 515*  --   BUN 14  --   CREATININE 0.57  --   CALCIUM 8.7*  --   PROT 6.7  --   ALBUMIN 3.5  --   AST 35  --   ALT 41  --   ALKPHOS 117  --   BILITOT 0.5  --   GFRNONAA >60  --   ANIONGAP 11  --      Hematology Recent Labs  Lab 07/23/20 2133 07/23/20 2338  WBC 7.8  --   RBC 5.01  --   HGB 15.1* 15.0  HCT 43.2 44.0  MCV 86.2  --   MCH 30.1  --   MCHC 35.0  --   RDW 11.9  --   PLT 232  --     BNP Recent Labs  Lab 07/23/20 2133  BNP 85.1     DDimer No results for input(s): DDIMER in the last 168 hours.   Radiology    DG Chest 2 View  Result Date: 07/23/2020 CLINICAL DATA:  Syncope. EXAM: CHEST - 2 VIEW COMPARISON:  None. FINDINGS: The  heart size and mediastinal contours are within normal limits. Vague nodular density within the right upper lobe measures approximately 1.5 cm and is indeterminate. No pleural effusion, pulmonary edema or airspace consolidation. The visualized skeletal structures are unremarkable. IMPRESSION: 1. No acute cardiopulmonary abnormalities. 2. Indeterminate nodular density within the right upper lobe. Consider further evaluation with nonemergent CT of the chest. Electronically Signed   By: Signa Kellaylor  Stroud M.D.   On: 07/23/2020 21:54   CT Angio Chest PE W and/or Wo Contrast  Result Date: 07/24/2020 CLINICAL DATA:  Pulmonary embolus suspected.  Syncope. EXAM: CT ANGIOGRAPHY CHEST WITH CONTRAST TECHNIQUE: Multidetector CT imaging of the chest was performed using the standard protocol during bolus administration of intravenous contrast. Multiplanar CT image reconstructions and MIPs were obtained to evaluate the vascular anatomy. CONTRAST:  75mL OMNIPAQUE IOHEXOL 350 MG/ML SOLN COMPARISON:  Chest x-ray 07/23/2020 FINDINGS: Cardiovascular: Satisfactory opacification of the pulmonary arteries to the segmental level. No evidence of  pulmonary embolism. The main pulmonary artery is normal in caliber. Normal heart size. No significant pericardial effusion. The thoracic aorta is normal in caliber. Trace atherosclerotic plaque of the thoracic aorta. No coronary artery calcifications. Mediastinum/Nodes: No enlarged mediastinal, hilar, or axillary lymph nodes. Thyroid gland, trachea, and esophagus demonstrate no significant findings. Lungs/Pleura: Bilateral lower lobe subsegmental atelectasis. No focal consolidation. Punctate calcified nodule within the left lower lobe. Punctate calcified nodule within the right upper lobe. No pulmonary mass. No pleural effusion. No pneumothorax. Upper Abdomen: No acute abnormality. Musculoskeletal: No chest wall abnormality. No suspicious lytic or blastic osseous lesions. No acute displaced fracture. Multilevel degenerative changes of the spine. Review of the MIP images confirms the above findings. IMPRESSION: 1. No pulmonary embolus. 2. No acute intrathoracic abnormality. Electronically Signed   By: Tish FredericksonMorgane  Naveau M.D.   On: 07/24/2020 06:22   ECHOCARDIOGRAM COMPLETE  Result Date: 07/24/2020    ECHOCARDIOGRAM REPORT   Patient Name:   Kimberly Fry Date of Exam: 07/24/2020 Medical Rec #:  [id number]               Height: Accession #:    [id number]              Weight: Date of Birth:  09-03-1965               BSA: Patient Age:    55 years                BP:           79/37 mmHg Patient Gender: F                       HR:           69 bpm. Exam Location:  Inpatient Procedure: 2D Echo Indications:    R07.9* Chest pain, unspecified  History:        Patient has no prior history of Echocardiogram examinations.                 Risk Factors:Diabetes.  Sonographer:    Elmarie Shileyiffany Dance Referring Phys: [id number] Deno LungerGEORGE J SHALHOUB IMPRESSIONS  1. Left ventricular ejection fraction, by estimation, is 60 to 65%. The left ventricle has normal function. The left ventricle has no regional wall motion abnormalities. Left  ventricular diastolic parameters are consistent with Grade I diastolic dysfunction (impaired relaxation).  2. Right ventricular systolic function is normal. The right ventricular size is normal. There is normal pulmonary artery systolic pressure.  3. The  mitral valve is normal in structure. No evidence of mitral valve regurgitation. No evidence of mitral stenosis.  4. The aortic valve is tricuspid. Aortic valve regurgitation is not visualized. No aortic stenosis is present.  5. The inferior vena cava is normal in size with greater than 50% respiratory variability, suggesting right atrial pressure of 3 mmHg. FINDINGS  Left Ventricle: Left ventricular ejection fraction, by estimation, is 60 to 65%. The left ventricle has normal function. The left ventricle has no regional wall motion abnormalities. The left ventricular internal cavity size was normal in size. There is  no left ventricular hypertrophy. Left ventricular diastolic parameters are consistent with Grade I diastolic dysfunction (impaired relaxation). Normal left ventricular filling pressure. Right Ventricle: The right ventricular size is normal. No increase in right ventricular wall thickness. Right ventricular systolic function is normal. There is normal pulmonary artery systolic pressure. The tricuspid regurgitant velocity is 1.63 m/s, and  with an assumed right atrial pressure of 3 mmHg, the estimated right ventricular systolic pressure is 13.6 mmHg. Left Atrium: Left atrial size was normal in size. Right Atrium: Right atrial size was normal in size. Pericardium: There is no evidence of pericardial effusion. Mitral Valve: The mitral valve is normal in structure. No evidence of mitral valve regurgitation. No evidence of mitral valve stenosis. Tricuspid Valve: The tricuspid valve is normal in structure. Tricuspid valve regurgitation is trivial. No evidence of tricuspid stenosis. Aortic Valve: The aortic valve is tricuspid. Aortic valve regurgitation is not  visualized. No aortic stenosis is present. Pulmonic Valve: The pulmonic valve was normal in structure. Pulmonic valve regurgitation is not visualized. No evidence of pulmonic stenosis. Aorta: The aortic root is normal in size and structure. Venous: The inferior vena cava is normal in size with greater than 50% respiratory variability, suggesting right atrial pressure of 3 mmHg. IAS/Shunts: No atrial level shunt detected by color flow Doppler.  LEFT VENTRICLE PLAX 2D LVIDd:         4.30 cm  Diastology LVIDs:         2.50 cm  LV e' medial:    5.47 cm/s LV PW:         0.80 cm  LV E/e' medial:  8.2 LV IVS:        0.60 cm  LV e' lateral:   8.08 cm/s LVOT diam:     1.70 cm  LV E/e' lateral: 5.6 LV SV:         36 LVOT Area:     2.27 cm  RIGHT VENTRICLE            IVC RV Basal diam:  2.30 cm    IVC diam: 1.10 cm RV S prime:     9.32 cm/s TAPSE (M-mode): 1.6 cm LEFT ATRIUM             RIGHT ATRIUM LA diam:        3.00 cm RA Area:     7.15 cm LA Vol (A2C):   27.7 ml RA Volume:   11.00 ml LA Vol (A4C):   19.2 ml LA Biplane Vol: 24.5 ml  AORTIC VALVE LVOT Vmax:   91.90 cm/s LVOT Vmean:  59.600 cm/s LVOT VTI:    0.157 m  AORTA Ao Root diam: 3.10 cm Ao Asc diam:  3.30 cm MITRAL VALVE               TRICUSPID VALVE MV Area (PHT): 3.48 cm    TR Peak grad:   10.6 mmHg MV  Decel Time: 218 msec    TR Vmax:        163.00 cm/s MV E velocity: 44.90 cm/s MV A velocity: 74.40 cm/s  SHUNTS MV E/A ratio:  0.60        Systemic VTI:  0.16 m                            Systemic Diam: 1.70 cm Chilton Si MD Electronically signed by Chilton Si MD Signature Date/Time: 07/24/2020/4:21:57 PM    Final     Cardiac Studies   Echocardiogram 07/24/2020: Impressions:  1. Left ventricular ejection fraction, by estimation, is 60 to 65%. The  left ventricle has normal function. The left ventricle has no regional  wall motion abnormalities. Left ventricular diastolic parameters are  consistent with Grade I diastolic  dysfunction (impaired  relaxation).   2. Right ventricular systolic function is normal. The right ventricular  size is normal. There is normal pulmonary artery systolic pressure.   3. The mitral valve is normal in structure. No evidence of mitral valve  regurgitation. No evidence of mitral stenosis.   4. The aortic valve is tricuspid. Aortic valve regurgitation is not  visualized. No aortic stenosis is present.   5. The inferior vena cava is normal in size with greater than 50%  respiratory variability, suggesting right atrial pressure of 3 mmHg.   Patient Profile     55 y.o. Spanish speaking female with a history of type 2 diabetes mellitus and medical noncompliance who is being seen for evaluation of  chest pain and syncope at the request of Dr. Mahala Menghini.  Assessment & Plan    Chest Pain - Occurred in the setting of learning her mother had suddenly died. - High-sensitivity troponin peaked at 193. - Chest CTA negative for PE.  - Echo showed LVEF of 60-65% with normal wall motion. - No recurrent chest pain. - Possible demand ischemic from markedly elevated blood sugar. May also be stress-induced cardiomyopathy after learning about the death of her mother. Plan is for coronary CTA today to rule out CAD.  Syncope - Possibly vasovagal after receiving news that her mother died.  - Orthostatic negative but baseline BP is low (systolic BP in the 90s). - Echo showed normal LV function with no significant valvular disease.  - Plan is for coronary CTA today to rule out CAD. - Will also need 30-day event monitor at discharge.  Type 2 Diabetes  - Blood glucose 515 on admission. Currently not on medical therapy at home. - Hemoglobin A1c pending. - Management per primary team.  Of note, used [employer] Interpreter Vernona Rieger [phone]) for entirety of visit.  For questions or updates, please contact [facility] Please consult [email or web address] for contact info under        Signed, Corrin Parker, PA-C  07/25/2020,  6:49 AM

## 2020-07-25 NOTE — Plan of Care (Signed)
  Problem: Clinical Measurements: Goal: Ability to maintain clinical measurements within normal limits will improve Outcome: Progressing Goal: Will remain free from infection Outcome: Progressing Goal: Respiratory complications will improve Outcome: Progressing Goal: Cardiovascular complication will be avoided Outcome: Progressing   Problem: Activity: Goal: Risk for activity intolerance will decrease Outcome: Progressing   Problem: Nutrition: Goal: Adequate nutrition will be maintained Outcome: Progressing   Problem: Coping: Goal: Level of anxiety will decrease Outcome: Progressing   

## 2020-07-25 NOTE — Progress Notes (Addendum)
   It was noted that QTc was very prolonged on telemetry. EKG was ordered and showed normal sinus rhythm with QTc of 549 ms. QTc has been getting progressively longer throughout admission: 447 ms >> 496 ms >> 549 ms. She is not on any medications that would cause this. She presented with syncope and has complained of vague chest discomfort that she described as a "storm" this morning. Clarified with patient this afternoon - she describes right sided head pain for the last 3 weeks or so that improves with Tylenol. Patient is a difficult historian even with the use of Spanish interpreter. Discussed with Dr. Mayford Knife and there is concern that she may have a CNS problem. Dr. Mayford Knife recommended brain MRI and head/neck MRA. Discussed this with patient and have placed these ordered. Potassium 3.7 and Magnesium 1.7. Will give of K-Dur and 2g of Mag sulfate. Keep potassium > 4 and magnesium > 2.0. Plan will be to consult EP tomorrow if MRI/MRA negative.   Used [employer] [phone].  Corrin Parker, PA-C 07/25/2020 4:28 PM

## 2020-07-25 NOTE — Progress Notes (Signed)
[facility] night shift.  The nursing staff reported that the patient's blood glucose was 411 mg/dL.  The staff stated that she ate around 2030.  Her weight is about 41 kg.  NovoLog 3 units SQ x1.  Sanda Klein, MD

## 2020-07-25 NOTE — Progress Notes (Addendum)
Interpreter line called to discuss overnight POC and complete admission questions.   Pt states that she has not been taking her metformin for 2 months because it was causing some GI discomfort. She states that she has been trying home remedies that she thought were working. She also states that she has gastritis and was told she was anemic & had kidney issues at some point in the past. RN spoke with pt diabetes and about the importance of taking medications /reporting side effects for adjustment or changes. Also counseled patient on dietary choices and need for further education/follow up.

## 2020-07-26 ENCOUNTER — Inpatient Hospital Stay: Payer: Self-pay

## 2020-07-26 ENCOUNTER — Other Ambulatory Visit (HOSPITAL_COMMUNITY): Payer: Self-pay

## 2020-07-26 ENCOUNTER — Inpatient Hospital Stay (HOSPITAL_COMMUNITY): Payer: Self-pay

## 2020-07-26 ENCOUNTER — Other Ambulatory Visit: Payer: Self-pay | Admitting: Physician Assistant

## 2020-07-26 DIAGNOSIS — R9431 Abnormal electrocardiogram [ECG] [EKG]: Secondary | ICD-10-CM

## 2020-07-26 DIAGNOSIS — R55 Syncope and collapse: Secondary | ICD-10-CM

## 2020-07-26 LAB — ECHOCARDIOGRAM LIMITED
AR max vel: 1.89 cm2
AV Area VTI: 1.93 cm2
AV Area mean vel: 1.86 cm2
AV Mean grad: 5 mmHg
AV Peak grad: 8.9 mmHg
Ao pk vel: 1.49 m/s
Area-P 1/2: 3.91 cm2
Height: 64 in
S' Lateral: 2.1 cm
Weight: 1440 oz

## 2020-07-26 LAB — BASIC METABOLIC PANEL
Anion gap: 6 (ref 5–15)
BUN: 16 mg/dL (ref 6–20)
CO2: 25 mmol/L (ref 22–32)
Calcium: 8.6 mg/dL — ABNORMAL LOW (ref 8.9–10.3)
Chloride: 103 mmol/L (ref 98–111)
Creatinine, Ser: 0.5 mg/dL (ref 0.44–1.00)
GFR, Estimated: >60 mL/min (ref >60)
Glucose, Bld: 164 mg/dL — ABNORMAL HIGH (ref 70–99)
Potassium: 4.2 mmol/L (ref 3.5–5.1)
Sodium: 134 mmol/L — ABNORMAL LOW (ref 135–145)

## 2020-07-26 LAB — ACTH STIMULATION, 3 TIME POINTS
Cortisol, 30 Min: 25.1 ug/dL
Cortisol, 60 Min: 33.5 ug/dL
Cortisol, Base: 7.1 ug/dL

## 2020-07-26 LAB — GLUCOSE, CAPILLARY
Glucose-Capillary: 328 mg/dL — ABNORMAL HIGH (ref 70–99)
Glucose-Capillary: 319 mg/dL — ABNORMAL HIGH (ref 70–99)
Glucose-Capillary: 183 mg/dL — ABNORMAL HIGH (ref 70–99)
Glucose-Capillary: 294 mg/dL — ABNORMAL HIGH (ref 70–99)

## 2020-07-26 LAB — MAGNESIUM: Magnesium: 1.9 mg/dL (ref 1.7–2.4)

## 2020-07-26 MED ORDER — NADOLOL 20 MG PO TABS
20.0000 mg | ORAL_TABLET | Freq: Every day | ORAL | Status: DC
  Administered 2020-07-26 – 2020-07-27 (×2): 20 mg via ORAL
  Filled 2020-07-26 (×2): qty 1

## 2020-07-26 MED ORDER — GLIMEPIRIDE 2 MG PO TABS
1.0000 mg | ORAL_TABLET | Freq: Every day | ORAL | 12 refills | Status: DC
  Filled 2020-07-26: qty 30, 30d supply, fill #0

## 2020-07-26 MED ORDER — ACCU-CHEK SOFTCLIX LANCETS MISC
5 refills | Status: AC
  Filled 2020-07-26: qty 100, fill #0

## 2020-07-26 MED ORDER — ACCU-CHEK GUIDE VI STRP
ORAL_STRIP | 12 refills | Status: AC
  Filled 2020-07-26: qty 100, 30d supply, fill #0

## 2020-07-26 MED ORDER — ATORVASTATIN CALCIUM 40 MG PO TABS
40.0000 mg | ORAL_TABLET | Freq: Every day | ORAL | 12 refills | Status: DC
  Filled 2020-07-26: qty 30, 30d supply, fill #0

## 2020-07-26 MED ORDER — ASPIRIN 325 MG PO TABS: 325.0000 mg | ORAL_TABLET | Freq: Every day | ORAL | 0 refills | Status: DC

## 2020-07-26 MED ORDER — LIVING WELL WITH DIABETES BOOK - IN SPANISH
Freq: Once | Status: AC
  Filled 2020-07-26: qty 1

## 2020-07-26 MED ORDER — ACCU-CHEK GUIDE W/DEVICE KIT
1.0000 | PACK | Freq: Four times a day (QID) | 0 refills | Status: AC
  Filled 2020-07-26: qty 1, fill #0

## 2020-07-26 MED ORDER — INSULIN ASPART PROT & ASPART (70-30 MIX) 100 UNIT/ML ~~LOC~~ SUSP
12.0000 [IU] | Freq: Two times a day (BID) | SUBCUTANEOUS | Status: DC
  Administered 2020-07-26 (×2): 12 [IU] via SUBCUTANEOUS

## 2020-07-26 MED ORDER — "INSULIN SYRINGE-NEEDLE U-100 30G X 1/2"" 0.3 ML MISC"
6 refills | Status: AC
  Filled 2020-07-26: qty 100, 30d supply, fill #0

## 2020-07-26 MED ORDER — INSULIN ASPART PROT & ASPART (70-30 MIX) 100 UNIT/ML ~~LOC~~ SUSP
14.0000 [IU] | Freq: Two times a day (BID) | SUBCUTANEOUS | 11 refills | Status: DC
  Filled 2020-07-26: qty 10, 28d supply, fill #0

## 2020-07-26 NOTE — Progress Notes (Signed)
  Echocardiogram 2D Echocardiogram has been performed.  Shirlean Kelly 07/26/2020, 9:18 AM

## 2020-07-26 NOTE — Progress Notes (Signed)
Reviewed giving injections with 70/30 demo pen with patient and her daughter Kimberly Fry using interpreter.  Kimberly Fry was able to give practice injection without problems. Reviewed glucometer. Patient and daughter denied further questions at this time.

## 2020-07-26 NOTE — Care Management (Addendum)
1157 07-26-20 Case Manager received consult for primary care provider (PCP) and medication assistance. CMA to make an appointment with one of the community clinics for this patient. The appointments at the clinics are scheduling into July & August. Case Manager completed MATCH for this patient.  [facility] will assist with medications. Case Manager will discuss with diabetes coordinator regarding a glucose meter for home. Case Manager will continue to follow.   07-26-20 1225 Case Manager spoke with the Diabetes Coordinator, and she will provide glucose meter, lancets, and stirps. Patient will be able to use [facility] for the extra supplies. No further needs from Case Manager at this time.

## 2020-07-26 NOTE — Progress Notes (Signed)
Inpatient Diabetes Program Recommendations  AACE/ADA: New Consensus Statement on Inpatient Glycemic Control (2015)  Target Ranges:  Prepandial:   less than 140 mg/dL      Peak postprandial:   less than 180 mg/dL (1-2 hours)      Critically ill patients:  140 - 180 mg/dL   Lab Results  Component Value Date   GLUCAP 319 (H) 07/26/2020   HGBA1C >15.5 (H) 07/24/2020    Review of Glycemic Control Results for Fry, Kimberly JESUS (MRN [id number]) as of 07/26/2020 13:02  Ref. Range 07/25/2020 11:48 07/25/2020 16:32 07/25/2020 21:55 07/26/2020 08:17 07/26/2020 11:47  Glucose-Capillary Latest Ref Range: 70 - 99 mg/dL 122 (H) 255 (H) 411 (H) 328 (H) 319 (H)   Diabetes history: New DM2  Current orders for Inpatient glycemic control: 70/30 12 units BID, Amaryl 1 mg daily, Novolog 0-15 units TID and 3 units TID with meals  Note:  Spoke with patient at bedside with Graciela, interpreter.  Provided patient with Relion glucometer.  Demonstrated how to use and asked her to check CBG's fasting and 3 hrs after lunch.  She is aware she can get more strips and lancets at [facility] when she gets low on supplies.  She should take her glucometer to her follow up appointments for review.  Re-educated on hypoglycemia; she states she should drink some juice or have something with sugar in it to treat.  Re-educated her on The Plate Method and CHO's.  Ordered The Living Well with Diabetes booklet in Spanish.  RN is going to look for an insulin pen station to help educate her daughter when she arrives later today.  Insulin Pen starter kit is at bedside with step by step instructions on how to administer.  All questions answered.   Will continue to follow while inpatient.  Thank you, Jen Miller, RN, BSN Diabetes Coordinator Inpatient Diabetes Program [phone] (team pager from 8a-5p)    

## 2020-07-26 NOTE — Progress Notes (Addendum)
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 Mayford Knife Developed increasing QTC 6/23 afternoon prompting input from EP which is pending Adrenal insufficiency being ruled out as below Severe hyperglycemia still-patient not ready for discharge    Hospital-Problem based course  Syncope collapse DDx orthostatic hypotension, adrenal insufficiency-diagnosis unclear at this time-in the setting of prolonged QT?  Arrhythmia being main driver MRI brain 8/11 (ordered by cardiology rule out intracranial pathology) grossly negative-unlikely etiology to syncope Orthostatics now resolved--Echocardiogram shows no significant valvular issue  ?  Adrenal insufficiency (would expect hyponatremia in 60 to 70% of cases, hypokalemia in 30% of cases )--a.m. cortisol at 10/17/2009 0.7 is equivocal-- Short ACTH testing negative Empty sella syndrome? With hormone levels reasonable [above] --no further work-up Elevated troponin Prolonged QtC Probable arrhythmia CT coronary suboptimal however discussed personally with Dr. Senaida Ores plan for ischemic work-up QtC progressively was prolonged on 6/23 afternoon EP consult vs rpt ECHO? 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) Start Amaryl 1 mg breakfast Lantus-->7030 insulin--increase to 12 units twice daily given hyperglycemia overnight 6/23   DVT prophylaxis: Lovenox Code Status: Full Family Communication: No family present at bedside ;ong discussionw ith RN and patient with  interpreter re: importance DM follow up Will call in meds to the [facility] today Disposition:  Status is: Observation  The patient will require care spanning > 2 midnights and should be moved to inpatient because: Hemodynamically unstable, Persistent severe electrolyte disturbances, and Unsafe d/c plan  Dispo: The patient is from: Home              Anticipated d/c is to: Home              Patient currently is not medically stable to d/c.   Difficult to place patient Yes       Consultants:  Cardiology Electrophysiology echocardiogram CT  Procedures:   Echocardiogram 6/22 CT angio 6/23 MRI brain MRA 6/23   Antimicrobials:  None   Subjective:  Some "lack of clarity" Vision blurred overall improved today Mild CP overnight  Objective: Vitals:   07/25/20 1157 07/25/20 1500 07/25/20 2100 07/26/20 0542  BP:  102/62 102/77 107/68  Pulse: 70 60 68 64  Resp:  Temp:  98.6 F (37 C) 98.8 F (37.1 C) 98 F (36.7 C)  TempSrc:  Oral Oral Oral  SpO2: 99% 99% 98% 99%  Weight:      Height:        Intake/Output Summary (Last 24 hours) at 07/26/2020 0750 Last data filed at 07/25/2020 2007 Gross per 24 hour  Intake 410 ml  Output --  Net 410 ml    Filed Weights   07/24/20 0900  Weight: 40.8 kg    Examination:  Asthenic no distress S1 s2 QTC looks prolonged to me on monitors Cta b no rales Neck soft, no thyromeg No le edema   Data Reviewed: personally reviewed   CBC    Component Value Date/Time   WBC 7.8 07/23/2020 2133   RBC 5.01 07/23/2020 2133   HGB 15.0 07/23/2020 2338   HCT 44.0 07/23/2020 2338  PLT 232 07/23/2020 2133   MCV 86.2 07/23/2020 2133   MCH 30.1 07/23/2020 2133   MCHC 35.0 07/23/2020 2133   RDW 11.9 07/23/2020 2133   LYMPHSABS 2.0 07/23/2020 2133   MONOABS 1.0 07/23/2020 2133   EOSABS 0.1 07/23/2020 2133   BASOSABS 0.1 07/23/2020 2133   CMP Latest Ref Rng & Units 07/26/2020 07/25/2020 07/23/2020  Glucose 70 - 99 mg/dL 409(W)  119(J) -  BUN 6 - 20 mg/dL 16 16 -  Creatinine 4.78 - 1.00 mg/dL 2.95 6.21 -  Sodium 308 - 145 mmol/L 134(L) 135 135  Potassium 3.5 - 5.1 mmol/L 4.2 3.7 3.9  Chloride 98 - 111 mmol/L 103 103 -  CO2 22 - 32 mmol/L 25 25 -  Calcium 8.9 - 10.3 mg/dL 6.5(H) 8.4(O) -  Total Protein 6.5 - 8.1 g/dL - 5.8(L) -  Total Bilirubin 0.3 - 1.2 mg/dL - 0.8 -  Alkaline Phos 38 - 126 U/L - 80 -  AST 15 - 41 U/L - 27 -  ALT 0 - 44 U/L - 30 -     Radiology Studies: MR ANGIO HEAD WO CONTRAST  Result Date: 07/25/2020 CLINICAL DATA:  Initial evaluation for syncope, headache. EXAM: MRI HEAD WITHOUT AND WITH CONTRAST MRA HEAD WITHOUT CONTRAST MRA NECK WITHOUT AND WITH CONTRAST TECHNIQUE: Multiplanar, multi-echo pulse sequences of the brain and surrounding structures were acquired without intravenous contrast. Angiographic images of the Circle of Willis were acquired using MRA technique without intravenous contrast. Angiographic images of the neck were acquired using MRA technique without and with intravenous contrast. Carotid stenosis measurements (when applicable) are obtained utilizing NASCET criteria, using the distal internal carotid diameter as the denominator. CONTRAST:  4mL GADAVIST GADOBUTROL 1 MMOL/ML IV SOLN COMPARISON:  None available. FINDINGS: MRI HEAD FINDINGS Brain: Cerebral volume within normal limits for age. Scattered patchy T2/FLAIR hyperintensity seen involving the periventricular, deep, and subcortical white matter both cerebral hemispheres, nonspecific, but most likely related to chronic microvascular ischemic disease, mild in nature. No abnormal foci of restricted diffusion to suggest acute or subacute ischemia. Gray-white matter differentiation maintained. No encephalomalacia to suggest chronic cortical infarction. No foci of susceptibility artifact to suggest acute or chronic intracranial hemorrhage. No mass lesion, midline shift or mass effect. No hydrocephalus or extra-axial fluid collection.  Note made of an empty sella. Suprasellar region normal. Midline structures intact. No abnormal enhancement. Vascular: Major intracranial vascular flow voids are maintained. Skull and upper cervical spine: Craniocervical junction within normal limits. Bone marrow signal intensity normal. No scalp soft tissue abnormality. Sinuses/Orbits: Globes and orbital soft tissues within normal limits. Mild scattered mucosal thickening noted within the ethmoidal air cells and maxillary sinuses. Paranasal sinuses are otherwise clear. No mastoid effusion. Inner ear structures grossly normal. Other: None. MRA HEAD FINDINGS Anterior circulation: Distal cervical segments of the internal carotid arteries are widely patent with antegrade flow. Petrous, cavernous, and supraclinoid segments patent without stenosis or other abnormality. A1 segments patent bilaterally. Left A1 hypoplastic, accounting for the diminutive left ICA is compared to the right. Anterior communicating artery complex partially fenestrated bone otherwise unremarkable. Both ACAs widely patent to their distal aspects. No M1 stenosis or occlusion. Normal MCA bifurcations. Distal MCA branches well perfused and symmetric. Posterior circulation: Both vertebral arteries widely patent to the vertebrobasilar junction without stenosis. Right vertebral artery slightly dominant. Both PICA origins patent and normal. Basilar patent to its distal aspect without stenosis. Superior cerebellar arteries patent bilaterally. PCAs widely patent to their distal aspects without stenosis. Small  bilateral posterior communicating arteries noted. Anatomic variants: Hypoplastic left A1 segment. Slightly dominant right vertebral artery. No aneurysm or other vascular abnormality. MRA NECK FINDINGS Aortic arch: Visualized aortic arch normal in caliber with normal 3 vessel morphology. No stenosis or other abnormality about the origin of the great vessels. Right carotid system: Right common and  internal carotid arteries widely patent without stenosis, evidence for dissection or occlusion. Left carotid system: Left common and internal carotid arteries widely patent without stenosis, evidence for dissection or occlusion. Vertebral arteries: Evaluation of vertebral arteries mildly limited by motion. Both vertebral arteries arise from the subclavian arteries. No proximal subclavian artery stenosis. Both vertebral arteries widely patent without stenosis, evidence for dissection or occlusion. Other: None IMPRESSION: MRI HEAD: 1. No acute intracranial abnormality. 2. Mild patchy T2/FLAIR hyperintensities involving the supratentorial cerebral white matter, nonspecific, but most likely related to chronic microvascular ischemic disease, mild for age. 3. Empty sella. While this finding is often incidental in nature and of no clinical significance, this can also be seen in the setting of idiopathic intracranial hypertension. MRA HEAD: Normal intracranial MRA. No large vessel occlusion, hemodynamically significant stenosis, or other acute vascular abnormality. MRA NECK: Normal MRA of the neck. Electronically Signed   By: Rise Mu M.D.   On: 07/25/2020 20:08   MR ANGIO NECK W WO CONTRAST  Result Date: 07/25/2020 CLINICAL DATA:  Initial evaluation for syncope, headache. EXAM: MRI HEAD WITHOUT AND WITH CONTRAST MRA HEAD WITHOUT CONTRAST MRA NECK WITHOUT AND WITH CONTRAST TECHNIQUE: Multiplanar, multi-echo pulse sequences of the brain and surrounding structures were acquired without intravenous contrast. Angiographic images of the Circle of Willis were acquired using MRA technique without intravenous contrast. Angiographic images of the neck were acquired using MRA technique without and with intravenous contrast. Carotid stenosis measurements (when applicable) are obtained utilizing NASCET criteria, using the distal internal carotid diameter as the denominator. CONTRAST:  4mL GADAVIST GADOBUTROL 1 MMOL/ML  IV SOLN COMPARISON:  None available. FINDINGS: MRI HEAD FINDINGS Brain: Cerebral volume within normal limits for age. Scattered patchy T2/FLAIR hyperintensity seen involving the periventricular, deep, and subcortical white matter both cerebral hemispheres, nonspecific, but most likely related to chronic microvascular ischemic disease, mild in nature. No abnormal foci of restricted diffusion to suggest acute or subacute ischemia. Gray-white matter differentiation maintained. No encephalomalacia to suggest chronic cortical infarction. No foci of susceptibility artifact to suggest acute or chronic intracranial hemorrhage. No mass lesion, midline shift or mass effect. No hydrocephalus or extra-axial fluid collection. Note made of an empty sella. Suprasellar region normal. Midline structures intact. No abnormal enhancement. Vascular: Major intracranial vascular flow voids are maintained. Skull and upper cervical spine: Craniocervical junction within normal limits. Bone marrow signal intensity normal. No scalp soft tissue abnormality. Sinuses/Orbits: Globes and orbital soft tissues within normal limits. Mild scattered mucosal thickening noted within the ethmoidal air cells and maxillary sinuses. Paranasal sinuses are otherwise clear. No mastoid effusion. Inner ear structures grossly normal. Other: None. MRA HEAD FINDINGS Anterior circulation: Distal cervical segments of the internal carotid arteries are widely patent with antegrade flow. Petrous, cavernous, and supraclinoid segments patent without stenosis or other abnormality. A1 segments patent bilaterally. Left A1 hypoplastic, accounting for the diminutive left ICA is compared to the right. Anterior communicating artery complex partially fenestrated bone otherwise unremarkable. Both ACAs widely patent to their distal aspects. No M1 stenosis or occlusion. Normal MCA bifurcations. Distal MCA branches well perfused and symmetric. Posterior circulation: Both vertebral  arteries widely patent to the vertebrobasilar junction  without stenosis. Right vertebral artery slightly dominant. Both PICA origins patent and normal. Basilar patent to its distal aspect without stenosis. Superior cerebellar arteries patent bilaterally. PCAs widely patent to their distal aspects without stenosis. Small bilateral posterior communicating arteries noted. Anatomic variants: Hypoplastic left A1 segment. Slightly dominant right vertebral artery. No aneurysm or other vascular abnormality. MRA NECK FINDINGS Aortic arch: Visualized aortic arch normal in caliber with normal 3 vessel morphology. No stenosis or other abnormality about the origin of the great vessels. Right carotid system: Right common and internal carotid arteries widely patent without stenosis, evidence for dissection or occlusion. Left carotid system: Left common and internal carotid arteries widely patent without stenosis, evidence for dissection or occlusion. Vertebral arteries: Evaluation of vertebral arteries mildly limited by motion. Both vertebral arteries arise from the subclavian arteries. No proximal subclavian artery stenosis. Both vertebral arteries widely patent without stenosis, evidence for dissection or occlusion. Other: None IMPRESSION: MRI HEAD: 1. No acute intracranial abnormality. 2. Mild patchy T2/FLAIR hyperintensities involving the supratentorial cerebral white matter, nonspecific, but most likely related to chronic microvascular ischemic disease, mild for age. 3. Empty sella. While this finding is often incidental in nature and of no clinical significance, this can also be seen in the setting of idiopathic intracranial hypertension. MRA HEAD: Normal intracranial MRA. No large vessel occlusion, hemodynamically significant stenosis, or other acute vascular abnormality. MRA NECK: Normal MRA of the neck. Electronically Signed   By: Rise Mu M.D.   On: 07/25/2020 20:08   MR BRAIN W WO CONTRAST  Result Date:  07/25/2020 CLINICAL DATA:  Initial evaluation for syncope, headache. EXAM: MRI HEAD WITHOUT AND WITH CONTRAST MRA HEAD WITHOUT CONTRAST MRA NECK WITHOUT AND WITH CONTRAST TECHNIQUE: Multiplanar, multi-echo pulse sequences of the brain and surrounding structures were acquired without intravenous contrast. Angiographic images of the Circle of Willis were acquired using MRA technique without intravenous contrast. Angiographic images of the neck were acquired using MRA technique without and with intravenous contrast. Carotid stenosis measurements (when applicable) are obtained utilizing NASCET criteria, using the distal internal carotid diameter as the denominator. CONTRAST:  82mL GADAVIST GADOBUTROL 1 MMOL/ML IV SOLN COMPARISON:  None available. FINDINGS: MRI HEAD FINDINGS Brain: Cerebral volume within normal limits for age. Scattered patchy T2/FLAIR hyperintensity seen involving the periventricular, deep, and subcortical white matter both cerebral hemispheres, nonspecific, but most likely related to chronic microvascular ischemic disease, mild in nature. No abnormal foci of restricted diffusion to suggest acute or subacute ischemia. Gray-white matter differentiation maintained. No encephalomalacia to suggest chronic cortical infarction. No foci of susceptibility artifact to suggest acute or chronic intracranial hemorrhage. No mass lesion, midline shift or mass effect. No hydrocephalus or extra-axial fluid collection. Note made of an empty sella. Suprasellar region normal. Midline structures intact. No abnormal enhancement. Vascular: Major intracranial vascular flow voids are maintained. Skull and upper cervical spine: Craniocervical junction within normal limits. Bone marrow signal intensity normal. No scalp soft tissue abnormality. Sinuses/Orbits: Globes and orbital soft tissues within normal limits. Mild scattered mucosal thickening noted within the ethmoidal air cells and maxillary sinuses. Paranasal sinuses are  otherwise clear. No mastoid effusion. Inner ear structures grossly normal. Other: None. MRA HEAD FINDINGS Anterior circulation: Distal cervical segments of the internal carotid arteries are widely patent with antegrade flow. Petrous, cavernous, and supraclinoid segments patent without stenosis or other abnormality. A1 segments patent bilaterally. Left A1 hypoplastic, accounting for the diminutive left ICA is compared to the right. Anterior communicating artery complex partially fenestrated bone otherwise unremarkable.  Both ACAs widely patent to their distal aspects. No M1 stenosis or occlusion. Normal MCA bifurcations. Distal MCA branches well perfused and symmetric. Posterior circulation: Both vertebral arteries widely patent to the vertebrobasilar junction without stenosis. Right vertebral artery slightly dominant. Both PICA origins patent and normal. Basilar patent to its distal aspect without stenosis. Superior cerebellar arteries patent bilaterally. PCAs widely patent to their distal aspects without stenosis. Small bilateral posterior communicating arteries noted. Anatomic variants: Hypoplastic left A1 segment. Slightly dominant right vertebral artery. No aneurysm or other vascular abnormality. MRA NECK FINDINGS Aortic arch: Visualized aortic arch normal in caliber with normal 3 vessel morphology. No stenosis or other abnormality about the origin of the great vessels. Right carotid system: Right common and internal carotid arteries widely patent without stenosis, evidence for dissection or occlusion. Left carotid system: Left common and internal carotid arteries widely patent without stenosis, evidence for dissection or occlusion. Vertebral arteries: Evaluation of vertebral arteries mildly limited by motion. Both vertebral arteries arise from the subclavian arteries. No proximal subclavian artery stenosis. Both vertebral arteries widely patent without stenosis, evidence for dissection or occlusion. Other: None  IMPRESSION: MRI HEAD: 1. No acute intracranial abnormality. 2. Mild patchy T2/FLAIR hyperintensities involving the supratentorial cerebral white matter, nonspecific, but most likely related to chronic microvascular ischemic disease, mild for age. 3. Empty sella. While this finding is often incidental in nature and of no clinical significance, this can also be seen in the setting of idiopathic intracranial hypertension. MRA HEAD: Normal intracranial MRA. No large vessel occlusion, hemodynamically significant stenosis, or other acute vascular abnormality. MRA NECK: Normal MRA of the neck. Electronically Signed   By: Rise Mu M.D.   On: 07/25/2020 20:08   CT CORONARY MORPH W/CTA COR W/SCORE W/CA W/CM &/OR WO/CM  Addendum Date: 07/25/2020   ADDENDUM REPORT: 07/25/2020 14:52 EXAM: OVER-READ INTERPRETATION  CT CHEST The following report is an over-read performed by radiologist Dr. Maryelizabeth Rowan [facility] on 07/25/2020. This over-read does not include interpretation of cardiac or coronary anatomy or pathology. The CTA interpretation by the cardiologist is attached. COMPARISON:  None. FINDINGS: Limited view of the lung parenchyma demonstrates no suspicious nodularity. Airways are normal. Limited view of the mediastinum demonstrates no adenopathy. Esophagus normal. Limited view of the upper abdomen unremarkable. Limited view of the skeleton and chest wall is unremarkable. IMPRESSION: No significant extracardiac findings. Electronically Signed   By: Genevive Bi M.D.   On: 07/25/2020 14:52   Result Date: 07/25/2020 CLINICAL DATA:  18F with chest pain EXAM: Cardiac/Coronary CTA TECHNIQUE: The patient was scanned on a [employer]. FINDINGS: A 100 kV prospective scan was triggered in the descending thoracic aorta at 111 HU's. Axial non-contrast 3 mm slices were carried out through the heart. The data set was analyzed on a dedicated work station and scored using the Agatson  method. Gantry rotation speed was 250 msecs and collimation was .6 mm. 0.8 mg of sl NTG was given. The 3D data set was reconstructed in 5% intervals of the 35-75 % of the R-R cycle. Phases were analyzed on a dedicated work station using MPR, MIP and VRT modes. The patient received 80 cc of contrast. Coronary Arteries:  Normal coronary origin.  Right dominance. RCA is a large dominant artery that gives rise to PDA and PLA. There is no plaque. Distal RCA and PDA not visualized as field of view did not include the bottom of her heart on CTA (likely due to difference in breathing betweem calcium  score and CTA). No calcified plaque seen in this region on calcium score. Left main is a large artery that gives rise to LAD and LCX arteries. LAD is a large vessel that has no plaque. LCX is a non-dominant artery that gives rise to one large OM1 branch. There is no plaque. Other findings: Left Ventricle: Normal size Left Atrium: Normal size Pulmonary Veins: Normal configuration Right Ventricle: Normal size Right Atrium: Normal size Cardiac valves: No calcifications Thoracic aorta: Normal size Pulmonary Arteries: Normal size Systemic Veins: Normal drainage Pericardium: Normal thickness IMPRESSION: 1. Coronary calcium score of 0. 2. Normal coronary origin with right dominance. 3. No evidence of CAD. Distal RCA and PDA not visualized on CTA as field of view did not include the bottom of her heart (likely due to difference in patient's breathing between calcium score and CTA). No calcified plaque seen in these arteries on calcium score, but cannot exclude noncalcified plaque in distal RCA/PDA CAD-RADS 0 (in visualized arteries). No evidence of CAD (0%). Consider non-atherosclerotic causes of chest pain. Electronically Signed: By: Epifanio Lesches MD On: 07/25/2020 12:28   ECHOCARDIOGRAM COMPLETE  Result Date: 07/24/2020    ECHOCARDIOGRAM REPORT   Patient Name:   Kimberly Fry Date of Exam: 07/24/2020 Medical Rec  #:  [id number]               Height: Accession #:    [id number]              Weight: Date of Birth:  March 11, 1965               BSA: Patient Age:    55 years                BP:           79/37 mmHg Patient Gender: F                       HR:           69 bpm. Exam Location:  Inpatient Procedure: 2D Echo Indications:    R07.9* Chest pain, unspecified  History:        Patient has no prior history of Echocardiogram examinations.                 Risk Factors:Diabetes.  Sonographer:    Elmarie Shiley Dance Referring Phys: [id number] Deno Lunger [place] IMPRESSIONS  1. Left ventricular ejection fraction, by estimation, is 60 to 65%. The left ventricle has normal function. The left ventricle has no regional wall motion abnormalities. Left ventricular diastolic parameters are consistent with Grade I diastolic dysfunction (impaired relaxation).  2. Right ventricular systolic function is normal. The right ventricular size is normal. There is normal pulmonary artery systolic pressure.  3. The mitral valve is normal in structure. No evidence of mitral valve regurgitation. No evidence of mitral stenosis.  4. The aortic valve is tricuspid. Aortic valve regurgitation is not visualized. No aortic stenosis is present.  5. The inferior vena cava is normal in size with greater than 50% respiratory variability, suggesting right atrial pressure of 3 mmHg. FINDINGS  Left Ventricle: Left ventricular ejection fraction, by estimation, is 60 to 65%. The left ventricle has normal function. The left ventricle has no regional wall motion abnormalities. The left ventricular internal cavity size was normal in size. There is  no left ventricular hypertrophy. Left ventricular diastolic parameters are consistent with Grade I diastolic dysfunction (impaired relaxation). Normal left ventricular filling pressure.  Right Ventricle: The right ventricular size is normal. No increase in right ventricular wall thickness. Right ventricular systolic function is normal. There  is normal pulmonary artery systolic pressure. The tricuspid regurgitant velocity is 1.63 m/s, and  with an assumed right atrial pressure of 3 mmHg, the estimated right ventricular systolic pressure is 13.6 mmHg. Left Atrium: Left atrial size was normal in size. Right Atrium: Right atrial size was normal in size. Pericardium: There is no evidence of pericardial effusion. Mitral Valve: The mitral valve is normal in structure. No evidence of mitral valve regurgitation. No evidence of mitral valve stenosis. Tricuspid Valve: The tricuspid valve is normal in structure. Tricuspid valve regurgitation is trivial. No evidence of tricuspid stenosis. Aortic Valve: The aortic valve is tricuspid. Aortic valve regurgitation is not visualized. No aortic stenosis is present. Pulmonic Valve: The pulmonic valve was normal in structure. Pulmonic valve regurgitation is not visualized. No evidence of pulmonic stenosis. Aorta: The aortic root is normal in size and structure. Venous: The inferior vena cava is normal in size with greater than 50% respiratory variability, suggesting right atrial pressure of 3 mmHg. IAS/Shunts: No atrial level shunt detected by color flow Doppler.  LEFT VENTRICLE PLAX 2D LVIDd:         4.30 cm  Diastology LVIDs:         2.50 cm  LV e' medial:    5.47 cm/s LV PW:         0.80 cm  LV E/e' medial:  8.2 LV IVS:        0.60 cm  LV e' lateral:   8.08 cm/s LVOT diam:     1.70 cm  LV E/e' lateral: 5.6 LV SV:         36 LVOT Area:     2.27 cm  RIGHT VENTRICLE            IVC RV Basal diam:  2.30 cm    IVC diam: 1.10 cm RV S prime:     9.32 cm/s TAPSE (M-mode): 1.6 cm LEFT ATRIUM             RIGHT ATRIUM LA diam:        3.00 cm RA Area:     7.15 cm LA Vol (A2C):   27.7 ml RA Volume:   11.00 ml LA Vol (A4C):   19.2 ml LA Biplane Vol: 24.5 ml  AORTIC VALVE LVOT Vmax:   91.90 cm/s LVOT Vmean:  59.600 cm/s LVOT VTI:    0.157 m  AORTA Ao Root diam: 3.10 cm Ao Asc diam:  3.30 cm MITRAL VALVE               TRICUSPID VALVE MV  Area (PHT): 3.48 cm    TR Peak grad:   10.6 mmHg MV Decel Time: 218 msec    TR Vmax:        163.00 cm/s MV E velocity: 44.90 cm/s MV A velocity: 74.40 cm/s  SHUNTS MV E/A ratio:  0.60        Systemic VTI:  0.16 m                            Systemic Diam: 1.70 cm Chilton Si MD Electronically signed by Chilton Si MD Signature Date/Time: 07/24/2020/4:21:57 PM    Final      Scheduled Meds:  aspirin  325 mg Oral Daily   atorvastatin  40 mg Oral Daily   enoxaparin (LOVENOX)  injection  40 mg Subcutaneous Q24H   glimepiride  1 mg Oral Q breakfast   insulin aspart  0-15 Units Subcutaneous TID AC & HS   insulin aspart  3 Units Subcutaneous TID WC   insulin aspart protamine- aspart  8 Units Subcutaneous BID WC   Continuous Infusions:   LOS: 1 day   Time spent:  4744  Rhetta MuraJai-Gurmukh Wyndi Northrup, MD [facility] To contact the attending provider between 7A-7P or the covering provider during after hours 7P-7A, please log into the web site [email or web address] and access using universal [place] password for that web site. If you do not have the password, please call the hospital operator.  07/26/2020, 7:50 AM

## 2020-07-26 NOTE — Progress Notes (Unsigned)
Enrolled patient for a 14 day Zio XT monitor to be mailed to patients home   Dr Alysia Penna to read

## 2020-07-26 NOTE — Progress Notes (Addendum)
Progress Note  Patient Name: Kimberly Fry Date of Encounter: 07/26/2020  [facility] Cardiologist: Dr. Mayford Knife  Subjective   Denies any further CP and no syncope.  MRI MRA of the head and neck done due to syncope and headaches as well as new diffuse QT prolongation on EKG despite no QT prolonging drugs and normal coronary CTA. This showed no acute intracranial abnormality but did show chronic microvascular ishcemic disease and empty sella  Inpatient Medications    Scheduled Meds:  aspirin  325 mg Oral Daily   atorvastatin  40 mg Oral Daily   enoxaparin (LOVENOX) injection  40 mg Subcutaneous Q24H   glimepiride  1 mg Oral Q breakfast   insulin aspart  0-15 Units Subcutaneous TID AC & HS   insulin aspart  3 Units Subcutaneous TID WC   insulin aspart protamine- aspart  12 Units Subcutaneous BID WC   Continuous Infusions:  PRN Meds: acetaminophen, nitroGLYCERIN, ondansetron (ZOFRAN) IV, polyethylene glycol   Vital Signs    Vitals:   07/25/20 1157 07/25/20 1500 07/25/20 2100 07/26/20 0542  BP:  102/62 102/77 107/68  Pulse: 70 60 68 64  Resp:  16 16 16   Temp:  98.6 F (37 C) 98.8 F (37.1 C) 98 F (36.7 C)  TempSrc:  Oral Oral Oral  SpO2: 99% 99% 98% 99%  Weight:      Height:        Intake/Output Summary (Last 24 hours) at 07/26/2020 0811 Last data filed at 07/25/2020 2007 Gross per 24 hour  Intake 410 ml  Output --  Net 410 ml    Last 3 Weights 07/24/2020  Weight (lbs) 90 lb  Weight (kg) 40.824 kg      Telemetry    NSR - Personally Reviewed  ECG    No new ECG tracing today. - Personally Reviewed  Physical Exam   GEN: Well nourished, well developed in no acute distress HEENT: Normal NECK: No JVD; No carotid bruits LYMPHATICS: No lymphadenopathy CARDIAC:RRR, no murmurs, rubs, gallops RESPIRATORY:  Clear to auscultation without rales, wheezing or rhonchi  ABDOMEN: Soft, non-tender, non-distended MUSCULOSKELETAL:  No edema; No deformity   SKIN: Warm and dry NEUROLOGIC:  Alert and oriented x 3 PSYCHIATRIC:  Normal affect   Labs    High Sensitivity Troponin:   Recent Labs  Lab 07/23/20 2133 07/24/20 0001 07/24/20 0300  TROPONINIHS 193* 141* 65*       Chemistry Recent Labs  Lab 07/23/20 2133 07/23/20 2338 07/25/20 0757 07/26/20 0549  NA 134* 135 135 134*  K 3.9 3.9 3.7 4.2  CL 99  --  103 103  CO2 24  --  25 25  GLUCOSE 515*  --  296* 164*  BUN 14  --  16 16  CREATININE 0.57  --  0.50 0.50  CALCIUM 8.7*  --  8.8* 8.6*  PROT 6.7  --  5.8*  --   ALBUMIN 3.5  --  2.8*  --   AST 35  --  27  --   ALT 41  --  30  --   ALKPHOS 117  --  80  --   BILITOT 0.5  --  0.8  --   GFRNONAA >60  --  >60 >60  ANIONGAP 11  --  7 6      Hematology Recent Labs  Lab 07/23/20 2133 07/23/20 2338  WBC 7.8  --   RBC 5.01  --   HGB 15.1* 15.0  HCT  43.2 44.0  MCV 86.2  --   MCH 30.1  --   MCHC 35.0  --   RDW 11.9  --   PLT 232  --      BNP Recent Labs  Lab 07/23/20 2133  BNP 85.1      DDimer No results for input(s): DDIMER in the last 168 hours.   Radiology    MR ANGIO HEAD WO CONTRAST  Result Date: 07/25/2020 CLINICAL DATA:  Initial evaluation for syncope, headache. EXAM: MRI HEAD WITHOUT AND WITH CONTRAST MRA HEAD WITHOUT CONTRAST MRA NECK WITHOUT AND WITH CONTRAST TECHNIQUE: Multiplanar, multi-echo pulse sequences of the brain and surrounding structures were acquired without intravenous contrast. Angiographic images of the Circle of Willis were acquired using MRA technique without intravenous contrast. Angiographic images of the neck were acquired using MRA technique without and with intravenous contrast. Carotid stenosis measurements (when applicable) are obtained utilizing NASCET criteria, using the distal internal carotid diameter as the denominator. CONTRAST:  4mL GADAVIST GADOBUTROL 1 MMOL/ML IV SOLN COMPARISON:  None available. FINDINGS: MRI HEAD FINDINGS Brain: Cerebral volume within normal limits  for age. Scattered patchy T2/FLAIR hyperintensity seen involving the periventricular, deep, and subcortical white matter both cerebral hemispheres, nonspecific, but most likely related to chronic microvascular ischemic disease, mild in nature. No abnormal foci of restricted diffusion to suggest acute or subacute ischemia. Gray-white matter differentiation maintained. No encephalomalacia to suggest chronic cortical infarction. No foci of susceptibility artifact to suggest acute or chronic intracranial hemorrhage. No mass lesion, midline shift or mass effect. No hydrocephalus or extra-axial fluid collection. Note made of an empty sella. Suprasellar region normal. Midline structures intact. No abnormal enhancement. Vascular: Major intracranial vascular flow voids are maintained. Skull and upper cervical spine: Craniocervical junction within normal limits. Bone marrow signal intensity normal. No scalp soft tissue abnormality. Sinuses/Orbits: Globes and orbital soft tissues within normal limits. Mild scattered mucosal thickening noted within the ethmoidal air cells and maxillary sinuses. Paranasal sinuses are otherwise clear. No mastoid effusion. Inner ear structures grossly normal. Other: None. MRA HEAD FINDINGS Anterior circulation: Distal cervical segments of the internal carotid arteries are widely patent with antegrade flow. Petrous, cavernous, and supraclinoid segments patent without stenosis or other abnormality. A1 segments patent bilaterally. Left A1 hypoplastic, accounting for the diminutive left ICA is compared to the right. Anterior communicating artery complex partially fenestrated bone otherwise unremarkable. Both ACAs widely patent to their distal aspects. No M1 stenosis or occlusion. Normal MCA bifurcations. Distal MCA branches well perfused and symmetric. Posterior circulation: Both vertebral arteries widely patent to the vertebrobasilar junction without stenosis. Right vertebral artery slightly  dominant. Both PICA origins patent and normal. Basilar patent to its distal aspect without stenosis. Superior cerebellar arteries patent bilaterally. PCAs widely patent to their distal aspects without stenosis. Small bilateral posterior communicating arteries noted. Anatomic variants: Hypoplastic left A1 segment. Slightly dominant right vertebral artery. No aneurysm or other vascular abnormality. MRA NECK FINDINGS Aortic arch: Visualized aortic arch normal in caliber with normal 3 vessel morphology. No stenosis or other abnormality about the origin of the great vessels. Right carotid system: Right common and internal carotid arteries widely patent without stenosis, evidence for dissection or occlusion. Left carotid system: Left common and internal carotid arteries widely patent without stenosis, evidence for dissection or occlusion. Vertebral arteries: Evaluation of vertebral arteries mildly limited by motion. Both vertebral arteries arise from the subclavian arteries. No proximal subclavian artery stenosis. Both vertebral arteries widely patent without stenosis, evidence for dissection or occlusion.  Other: None IMPRESSION: MRI HEAD: 1. No acute intracranial abnormality. 2. Mild patchy T2/FLAIR hyperintensities involving the supratentorial cerebral white matter, nonspecific, but most likely related to chronic microvascular ischemic disease, mild for age. 3. Empty sella. While this finding is often incidental in nature and of no clinical significance, this can also be seen in the setting of idiopathic intracranial hypertension. MRA HEAD: Normal intracranial MRA. No large vessel occlusion, hemodynamically significant stenosis, or other acute vascular abnormality. MRA NECK: Normal MRA of the neck. Electronically Signed   By: Rise Mu M.D.   On: 07/25/2020 20:08   MR ANGIO NECK W WO CONTRAST  Result Date: 07/25/2020 CLINICAL DATA:  Initial evaluation for syncope, headache. EXAM: MRI HEAD WITHOUT AND WITH  CONTRAST MRA HEAD WITHOUT CONTRAST MRA NECK WITHOUT AND WITH CONTRAST TECHNIQUE: Multiplanar, multi-echo pulse sequences of the brain and surrounding structures were acquired without intravenous contrast. Angiographic images of the Circle of Willis were acquired using MRA technique without intravenous contrast. Angiographic images of the neck were acquired using MRA technique without and with intravenous contrast. Carotid stenosis measurements (when applicable) are obtained utilizing NASCET criteria, using the distal internal carotid diameter as the denominator. CONTRAST:  58mL GADAVIST GADOBUTROL 1 MMOL/ML IV SOLN COMPARISON:  None available. FINDINGS: MRI HEAD FINDINGS Brain: Cerebral volume within normal limits for age. Scattered patchy T2/FLAIR hyperintensity seen involving the periventricular, deep, and subcortical white matter both cerebral hemispheres, nonspecific, but most likely related to chronic microvascular ischemic disease, mild in nature. No abnormal foci of restricted diffusion to suggest acute or subacute ischemia. Gray-white matter differentiation maintained. No encephalomalacia to suggest chronic cortical infarction. No foci of susceptibility artifact to suggest acute or chronic intracranial hemorrhage. No mass lesion, midline shift or mass effect. No hydrocephalus or extra-axial fluid collection. Note made of an empty sella. Suprasellar region normal. Midline structures intact. No abnormal enhancement. Vascular: Major intracranial vascular flow voids are maintained. Skull and upper cervical spine: Craniocervical junction within normal limits. Bone marrow signal intensity normal. No scalp soft tissue abnormality. Sinuses/Orbits: Globes and orbital soft tissues within normal limits. Mild scattered mucosal thickening noted within the ethmoidal air cells and maxillary sinuses. Paranasal sinuses are otherwise clear. No mastoid effusion. Inner ear structures grossly normal. Other: None. MRA HEAD  FINDINGS Anterior circulation: Distal cervical segments of the internal carotid arteries are widely patent with antegrade flow. Petrous, cavernous, and supraclinoid segments patent without stenosis or other abnormality. A1 segments patent bilaterally. Left A1 hypoplastic, accounting for the diminutive left ICA is compared to the right. Anterior communicating artery complex partially fenestrated bone otherwise unremarkable. Both ACAs widely patent to their distal aspects. No M1 stenosis or occlusion. Normal MCA bifurcations. Distal MCA branches well perfused and symmetric. Posterior circulation: Both vertebral arteries widely patent to the vertebrobasilar junction without stenosis. Right vertebral artery slightly dominant. Both PICA origins patent and normal. Basilar patent to its distal aspect without stenosis. Superior cerebellar arteries patent bilaterally. PCAs widely patent to their distal aspects without stenosis. Small bilateral posterior communicating arteries noted. Anatomic variants: Hypoplastic left A1 segment. Slightly dominant right vertebral artery. No aneurysm or other vascular abnormality. MRA NECK FINDINGS Aortic arch: Visualized aortic arch normal in caliber with normal 3 vessel morphology. No stenosis or other abnormality about the origin of the great vessels. Right carotid system: Right common and internal carotid arteries widely patent without stenosis, evidence for dissection or occlusion. Left carotid system: Left common and internal carotid arteries widely patent without stenosis, evidence for dissection or occlusion.  Vertebral arteries: Evaluation of vertebral arteries mildly limited by motion. Both vertebral arteries arise from the subclavian arteries. No proximal subclavian artery stenosis. Both vertebral arteries widely patent without stenosis, evidence for dissection or occlusion. Other: None IMPRESSION: MRI HEAD: 1. No acute intracranial abnormality. 2. Mild patchy T2/FLAIR  hyperintensities involving the supratentorial cerebral white matter, nonspecific, but most likely related to chronic microvascular ischemic disease, mild for age. 3. Empty sella. While this finding is often incidental in nature and of no clinical significance, this can also be seen in the setting of idiopathic intracranial hypertension. MRA HEAD: Normal intracranial MRA. No large vessel occlusion, hemodynamically significant stenosis, or other acute vascular abnormality. MRA NECK: Normal MRA of the neck. Electronically Signed   By: Rise Mu M.D.   On: 07/25/2020 20:08   MR BRAIN W WO CONTRAST  Result Date: 07/25/2020 CLINICAL DATA:  Initial evaluation for syncope, headache. EXAM: MRI HEAD WITHOUT AND WITH CONTRAST MRA HEAD WITHOUT CONTRAST MRA NECK WITHOUT AND WITH CONTRAST TECHNIQUE: Multiplanar, multi-echo pulse sequences of the brain and surrounding structures were acquired without intravenous contrast. Angiographic images of the Circle of Willis were acquired using MRA technique without intravenous contrast. Angiographic images of the neck were acquired using MRA technique without and with intravenous contrast. Carotid stenosis measurements (when applicable) are obtained utilizing NASCET criteria, using the distal internal carotid diameter as the denominator. CONTRAST:  4mL GADAVIST GADOBUTROL 1 MMOL/ML IV SOLN COMPARISON:  None available. FINDINGS: MRI HEAD FINDINGS Brain: Cerebral volume within normal limits for age. Scattered patchy T2/FLAIR hyperintensity seen involving the periventricular, deep, and subcortical white matter both cerebral hemispheres, nonspecific, but most likely related to chronic microvascular ischemic disease, mild in nature. No abnormal foci of restricted diffusion to suggest acute or subacute ischemia. Gray-white matter differentiation maintained. No encephalomalacia to suggest chronic cortical infarction. No foci of susceptibility artifact to suggest acute or chronic  intracranial hemorrhage. No mass lesion, midline shift or mass effect. No hydrocephalus or extra-axial fluid collection. Note made of an empty sella. Suprasellar region normal. Midline structures intact. No abnormal enhancement. Vascular: Major intracranial vascular flow voids are maintained. Skull and upper cervical spine: Craniocervical junction within normal limits. Bone marrow signal intensity normal. No scalp soft tissue abnormality. Sinuses/Orbits: Globes and orbital soft tissues within normal limits. Mild scattered mucosal thickening noted within the ethmoidal air cells and maxillary sinuses. Paranasal sinuses are otherwise clear. No mastoid effusion. Inner ear structures grossly normal. Other: None. MRA HEAD FINDINGS Anterior circulation: Distal cervical segments of the internal carotid arteries are widely patent with antegrade flow. Petrous, cavernous, and supraclinoid segments patent without stenosis or other abnormality. A1 segments patent bilaterally. Left A1 hypoplastic, accounting for the diminutive left ICA is compared to the right. Anterior communicating artery complex partially fenestrated bone otherwise unremarkable. Both ACAs widely patent to their distal aspects. No M1 stenosis or occlusion. Normal MCA bifurcations. Distal MCA branches well perfused and symmetric. Posterior circulation: Both vertebral arteries widely patent to the vertebrobasilar junction without stenosis. Right vertebral artery slightly dominant. Both PICA origins patent and normal. Basilar patent to its distal aspect without stenosis. Superior cerebellar arteries patent bilaterally. PCAs widely patent to their distal aspects without stenosis. Small bilateral posterior communicating arteries noted. Anatomic variants: Hypoplastic left A1 segment. Slightly dominant right vertebral artery. No aneurysm or other vascular abnormality. MRA NECK FINDINGS Aortic arch: Visualized aortic arch normal in caliber with normal 3 vessel  morphology. No stenosis or other abnormality about the origin of the great vessels. Right  carotid system: Right common and internal carotid arteries widely patent without stenosis, evidence for dissection or occlusion. Left carotid system: Left common and internal carotid arteries widely patent without stenosis, evidence for dissection or occlusion. Vertebral arteries: Evaluation of vertebral arteries mildly limited by motion. Both vertebral arteries arise from the subclavian arteries. No proximal subclavian artery stenosis. Both vertebral arteries widely patent without stenosis, evidence for dissection or occlusion. Other: None IMPRESSION: MRI HEAD: 1. No acute intracranial abnormality. 2. Mild patchy T2/FLAIR hyperintensities involving the supratentorial cerebral white matter, nonspecific, but most likely related to chronic microvascular ischemic disease, mild for age. 3. Empty sella. While this finding is often incidental in nature and of no clinical significance, this can also be seen in the setting of idiopathic intracranial hypertension. MRA HEAD: Normal intracranial MRA. No large vessel occlusion, hemodynamically significant stenosis, or other acute vascular abnormality. MRA NECK: Normal MRA of the neck. Electronically Signed   By: Rise Mu M.D.   On: 07/25/2020 20:08   CT CORONARY MORPH W/CTA COR W/SCORE W/CA W/CM &/OR WO/CM  Addendum Date: 07/25/2020   ADDENDUM REPORT: 07/25/2020 14:52 EXAM: OVER-READ INTERPRETATION  CT CHEST The following report is an over-read performed by radiologist Dr. Maryelizabeth Rowan [facility] on 07/25/2020. This over-read does not include interpretation of cardiac or coronary anatomy or pathology. The CTA interpretation by the cardiologist is attached. COMPARISON:  None. FINDINGS: Limited view of the lung parenchyma demonstrates no suspicious nodularity. Airways are normal. Limited view of the mediastinum demonstrates no adenopathy. Esophagus normal.  Limited view of the upper abdomen unremarkable. Limited view of the skeleton and chest wall is unremarkable. IMPRESSION: No significant extracardiac findings. Electronically Signed   By: Genevive Bi M.D.   On: 07/25/2020 14:52   Result Date: 07/25/2020 CLINICAL DATA:  46F with chest pain EXAM: Cardiac/Coronary CTA TECHNIQUE: The patient was scanned on a [employer]. FINDINGS: A 100 kV prospective scan was triggered in the descending thoracic aorta at 111 HU's. Axial non-contrast 3 mm slices were carried out through the heart. The data set was analyzed on a dedicated work station and scored using the Agatson method. Gantry rotation speed was 250 msecs and collimation was .6 mm. 0.8 mg of sl NTG was given. The 3D data set was reconstructed in 5% intervals of the 35-75 % of the R-R cycle. Phases were analyzed on a dedicated work station using MPR, MIP and VRT modes. The patient received 80 cc of contrast. Coronary Arteries:  Normal coronary origin.  Right dominance. RCA is a large dominant artery that gives rise to PDA and PLA. There is no plaque. Distal RCA and PDA not visualized as field of view did not include the bottom of her heart on CTA (likely due to difference in breathing betweem calcium score and CTA). No calcified plaque seen in this region on calcium score. Left main is a large artery that gives rise to LAD and LCX arteries. LAD is a large vessel that has no plaque. LCX is a non-dominant artery that gives rise to one large OM1 branch. There is no plaque. Other findings: Left Ventricle: Normal size Left Atrium: Normal size Pulmonary Veins: Normal configuration Right Ventricle: Normal size Right Atrium: Normal size Cardiac valves: No calcifications Thoracic aorta: Normal size Pulmonary Arteries: Normal size Systemic Veins: Normal drainage Pericardium: Normal thickness IMPRESSION: 1. Coronary calcium score of 0. 2. Normal coronary origin with right dominance. 3. No evidence of CAD. Distal  RCA and PDA not visualized on CTA  as field of view did not include the bottom of her heart (likely due to difference in patient's breathing between calcium score and CTA). No calcified plaque seen in these arteries on calcium score, but cannot exclude noncalcified plaque in distal RCA/PDA CAD-RADS 0 (in visualized arteries). No evidence of CAD (0%). Consider non-atherosclerotic causes of chest pain. Electronically Signed: By: Epifanio Lesches MD On: 07/25/2020 12:28   ECHOCARDIOGRAM COMPLETE  Result Date: 07/24/2020    ECHOCARDIOGRAM REPORT   Patient Name:   Amarii JESUS PARADA PARADA Date of Exam: 07/24/2020 Medical Rec #:  [id number]               Height: Accession #:    [id number]              Weight: Date of Birth:  [birth year]               BSA: Patient Age:    55 years                BP:           79/37 mmHg Patient Gender: F                       HR:           69 bpm. Exam Location:  Inpatient Procedure: 2D Echo Indications:    R07.9* Chest pain, unspecified  History:        Patient has no prior history of Echocardiogram examinations.                 Risk Factors:Diabetes.  Sonographer:    Elmarie Shiley Dance Referring Phys: [id number] Deno Lunger [place] IMPRESSIONS  1. Left ventricular ejection fraction, by estimation, is 60 to 65%. The left ventricle has normal function. The left ventricle has no regional wall motion abnormalities. Left ventricular diastolic parameters are consistent with Grade I diastolic dysfunction (impaired relaxation).  2. Right ventricular systolic function is normal. The right ventricular size is normal. There is normal pulmonary artery systolic pressure.  3. The mitral valve is normal in structure. No evidence of mitral valve regurgitation. No evidence of mitral stenosis.  4. The aortic valve is tricuspid. Aortic valve regurgitation is not visualized. No aortic stenosis is present.  5. The inferior vena cava is normal in size with greater than 50% respiratory variability, suggesting right  atrial pressure of 3 mmHg. FINDINGS  Left Ventricle: Left ventricular ejection fraction, by estimation, is 60 to 65%. The left ventricle has normal function. The left ventricle has no regional wall motion abnormalities. The left ventricular internal cavity size was normal in size. There is  no left ventricular hypertrophy. Left ventricular diastolic parameters are consistent with Grade I diastolic dysfunction (impaired relaxation). Normal left ventricular filling pressure. Right Ventricle: The right ventricular size is normal. No increase in right ventricular wall thickness. Right ventricular systolic function is normal. There is normal pulmonary artery systolic pressure. The tricuspid regurgitant velocity is 1.63 m/s, and  with an assumed right atrial pressure of 3 mmHg, the estimated right ventricular systolic pressure is 13.6 mmHg. Left Atrium: Left atrial size was normal in size. Right Atrium: Right atrial size was normal in size. Pericardium: There is no evidence of pericardial effusion. Mitral Valve: The mitral valve is normal in structure. No evidence of mitral valve regurgitation. No evidence of mitral valve stenosis. Tricuspid Valve: The tricuspid valve is normal in structure. Tricuspid valve regurgitation is trivial. No evidence of tricuspid stenosis.  Aortic Valve: The aortic valve is tricuspid. Aortic valve regurgitation is not visualized. No aortic stenosis is present. Pulmonic Valve: The pulmonic valve was normal in structure. Pulmonic valve regurgitation is not visualized. No evidence of pulmonic stenosis. Aorta: The aortic root is normal in size and structure. Venous: The inferior vena cava is normal in size with greater than 50% respiratory variability, suggesting right atrial pressure of 3 mmHg. IAS/Shunts: No atrial level shunt detected by color flow Doppler.  LEFT VENTRICLE PLAX 2D LVIDd:         4.30 cm  Diastology LVIDs:         2.50 cm  LV e' medial:    5.47 cm/s LV PW:         0.80 cm  LV E/e'  medial:  8.2 LV IVS:        0.60 cm  LV e' lateral:   8.08 cm/s LVOT diam:     1.70 cm  LV E/e' lateral: 5.6 LV SV:         36 LVOT Area:     2.27 cm  RIGHT VENTRICLE            IVC RV Basal diam:  2.30 cm    IVC diam: 1.10 cm RV S prime:     9.32 cm/s TAPSE (M-mode): 1.6 cm LEFT ATRIUM             RIGHT ATRIUM LA diam:        3.00 cm RA Area:     7.15 cm LA Vol (A2C):   27.7 ml RA Volume:   11.00 ml LA Vol (A4C):   19.2 ml LA Biplane Vol: 24.5 ml  AORTIC VALVE LVOT Vmax:   91.90 cm/s LVOT Vmean:  59.600 cm/s LVOT VTI:    0.157 m  AORTA Ao Root diam: 3.10 cm Ao Asc diam:  3.30 cm MITRAL VALVE               TRICUSPID VALVE MV Area (PHT): 3.48 cm    TR Peak grad:   10.6 mmHg MV Decel Time: 218 msec    TR Vmax:        163.00 cm/s MV E velocity: 44.90 cm/s MV A velocity: 74.40 cm/s  SHUNTS MV E/A ratio:  0.60        Systemic VTI:  0.16 m                            Systemic Diam: 1.70 cm Chilton Siiffany [place] MD Electronically signed by Chilton Siiffany Has[place] MD Signature Date/Time: 07/24/2020/4:21:57 PM    Final     Cardiac Studies   Echocardiogram 07/24/2020: Impressions:  1. Left ventricular ejection fraction, by estimation, is 60 to 65%. The  left ventricle has normal function. The left ventricle has no regional  wall motion abnormalities. Left ventricular diastolic parameters are  consistent with Grade I diastolic  dysfunction (impaired relaxation).   2. Right ventricular systolic function is normal. The right ventricular  size is normal. There is normal pulmonary artery systolic pressure.   3. The mitral valve is normal in structure. No evidence of mitral valve  regurgitation. No evidence of mitral stenosis.   4. The aortic valve is tricuspid. Aortic valve regurgitation is not  visualized. No aortic stenosis is present.   5. The inferior vena cava is normal in size with greater than 50%  respiratory variability, suggesting right atrial pressure of 3 mmHg.   Patient  Profile     55 y.o. Spanish speaking  female with a history of type 2 diabetes mellitus and medical noncompliance who is being seen for evaluation of  chest pain and syncope at the request of Dr. Mahala Menghini.  Assessment & Plan    Chest Pain - Occurred in the setting of learning her mother had suddenly died. - High-sensitivity troponin peaked at 193. - Chest CTA negative for PE.  - Echo showed LVEF of 60-65% with normal wall motion. - No recurrent chest pain. - Possible demand ischemic from markedly elevated blood sugar. May also be stress-induced cardiomyopathy after learning about the death of her mother.  -coronary CTA showed no coronary ca and no CAD although the distal RCA and PDA were not visualized but there was no Ca in this area   Prolonged QTc -she developed QT prolongation yesterday with diffuse ST abnormality ? Etiology.   -In setting of severe HA and dizziness an MRI/MRA of the head and neck were done which showed chronic microvascular changes and empty sella but no no acute abnormality -she did get Ivabradine yesterday so ? If this was the culprit but her QT was already lengthening out the day before the Ivabradine -repeat EKG today and if still abnormal then will repeat echo to see if she could have developed stress induced DCM -if echo normal then will need EP consult for syncope with prolonged QT  Syncope - Possibly vasovagal after receiving news that her mother died.  - Orthostatic negative but baseline BP is low (systolic BP in the 90s). - Echo showed normal LV function with no significant valvular disease.  - coronary CTA with no CAD - Will also need 30-day event monitor at discharge.  Type 2 Diabetes  - Blood glucose 515 on admission. Currently not on medical therapy at home. - Hemoglobin A1c pending. - Management per primary team.   For questions or updates, please contact [facility] Please consult [email or web address] for contact info under        Signed, Armanda Magic, MD  07/26/2020, 8:11 AM

## 2020-07-26 NOTE — Consult Note (Addendum)
Cardiology Consultation:   Patient ID: Ryanne Morand MRN: [id number]; DOB: 1965-04-25  Admit date: 07/23/2020 Date of Consult: 07/26/2020  PCP:  Merryl Hacker No   [facility] Providers Cardiologist:  Fransico Him, MD    Patient Profile:   Haileyann Staiger is a 55 y.o. female with a hx of DM, poor compliance with management who is being seen 07/26/2020 for the evaluation of prolonged QT at the request of Dr. Radford Pax.  History of Present Illness:   Ms. Brae Schaafsma developed sudden onset of CP and SOB after hearing the news that her mother passed away (unexpectedly), this lasted some hours, and had a syncopal event, witnessed by her daughter and was described as brief by notes.  EMS was called   Single set of vitals 162/98, 80bpm, 97%, described feeling weak, but no CP, + slightly SOB, BS was 573, was able to ambulate under her own power to the stretcher, no ECG or monitor tracings noted  Mild abn Trop Initial EKG felt fairly unremarkable though has had some progressive QT prlongation since here. CT coronaries noted no CAD and noted no significant extracardiac findings CT chest neg for PE 2 echos have been done with preserved LVEF nd no WMA With c/o HAs brain MRi?MRAs were unrevealing, noting an empty sella  EP is asked to see regarding prolonging QT and syncope  LABS  K+ 3.9  >> 3.7 > 4.2 BS 515  >>> eventually today 164 HS Trop 193 > 141 > 65 WBC 7.8 H/H 15/43 Plts 232  VSS  Her only home med was Glucophage and was not taking of late or regularly Here: Corlanor was ordered, none given Zofran is ordered, none has been given   Today's visit was done with [employer] translator [place] Mote # [phone] The patient reports never having fainted before. She had just heard the news of her mother's death, felt terrible afterwards, very upset and with some CP and SOB About 5hours into this she fainted, was preceded by feeling of palpitations, she does not know for how long she was  out.  EMS record mentions family said "a few minutes" When they arrived she was awake, but weak  She historically has had trouble with low BPs over the years but has never fainted She denies any hx of unexplained deaths in her family, none who died young  Past Medical History:  Diagnosis Date   Diabetes mellitus without complication ([place])     History reviewed. No pertinent surgical history.   Home Medications:  Prior to Admission medications   Medication Sig Start Date End Date Taking? Authorizing Provider  Accu-Chek Softclix Lancets lancets Use as directed. 07/26/20  Yes Nita Sells, MD  Blood Glucose Monitoring Suppl (ACCU-CHEK GUIDE) w/Device KIT 1 each by Does not apply route 4 (four) times daily. 07/26/20  Yes Nita Sells, MD  glucose blood (ACCU-CHEK GUIDE) test strip Use as instructed 07/26/20  Yes Nita Sells, MD  Insulin Syringe-Needle U-100 30G X 1/2" 0.3 ML MISC Use as directed with insulin 07/26/20  Yes Nita Sells, MD  metFORMIN (GLUCOPHAGE) 500 MG tablet Take 500-1,000 mg by mouth 3 (three) times daily with meals. To keep glucose below 150.   Yes [provider]  aspirin 325 MG tablet Take 1 tablet (325 mg total) by mouth daily. 07/27/20   Nita Sells, MD  atorvastatin (LIPITOR) 40 MG tablet Take 1 tablet (40 mg total) by mouth daily. 07/27/20   Nita Sells, MD  glimepiride (AMARYL) 2 MG  tablet Take 0.5 tablets (1 mg total) by mouth daily with breakfast. 07/27/20   Nita Sells, MD  insulin aspart protamine- aspart (NOVOLOG MIX 70/30) (70-30) 100 UNIT/ML injection Inject 0.14 mLs (14 Units total) into the skin 2 (two) times daily with a meal. 07/26/20   Nita Sells, MD    Inpatient Medications: Scheduled Meds:  aspirin  325 mg Oral Daily   atorvastatin  40 mg Oral Daily   enoxaparin (LOVENOX) injection  40 mg Subcutaneous Q24H   glimepiride  1 mg Oral Q breakfast   insulin aspart  0-15 Units  Subcutaneous TID AC & HS   insulin aspart  3 Units Subcutaneous TID WC   insulin aspart protamine- aspart  12 Units Subcutaneous BID WC   living well with diabetes book- in spanish   Does not apply Once   Continuous Infusions:  PRN Meds: acetaminophen, nitroGLYCERIN, ondansetron (ZOFRAN) IV, polyethylene glycol  Allergies:   No Known Allergies  Social History:   Social History   Socioeconomic History   Marital status: Unknown    Spouse name: Not on file   Number of children: Not on file   Years of education: Not on file   Highest education level: Not on file  Occupational History   Not on file  Tobacco Use   Smoking status: Never   Smokeless tobacco: Never  Substance and Sexual Activity   Alcohol use: Never   Drug use: Never   Sexual activity: Never  Other Topics Concern   Not on file  Social History Narrative   Not on file   Social Determinants of Health   Financial Resource Strain: Not on file  Food Insecurity: Not on file  Transportation Needs: Not on file  Physical Activity: Not on file  Stress: Not on file  Social Connections: Not on file  Intimate Partner Violence: Not on file    Family History:   Family History  Problem Relation Age of Onset   Heart disease Father      ROS:  Please see the history of present illness.  All other ROS reviewed and negative.     Physical Exam/Data:   Vitals:   07/25/20 1500 07/25/20 2100 07/26/20 0542 07/26/20 0939  BP: 102/62 102/77 107/68 117/68  Pulse: 60 68 64 70  Resp: 16 16 16 16   Temp: 98.6 F (37 C) 98.8 F (37.1 C) 98 F (36.7 C) 99 F (37.2 C)  TempSrc: Oral Oral Oral Oral  SpO2: 99% 98% 99% 99%  Weight:      Height:        Intake/Output Summary (Last 24 hours) at 07/26/2020 1437 Last data filed at 07/26/2020 1400 Gross per 24 hour  Intake 890 ml  Output --  Net 890 ml   Last 3 Weights 07/24/2020  Weight (lbs) 90 lb  Weight (kg) 40.824 kg     Body mass index is 15.45 kg/m.  General:  Well  nourished, though quite thin, well developed, in no acute distress HEENT: normal Lymph: no adenopathy Neck: no JVD Endocrine:  No thryomegaly Vascular: No carotid bruits; FA pulses 2+ bilaterally without bruits  Cardiac:  RRR; no murmurs, gallops or rubs Lungs:  CTA b/l, no wheezing, rhonchi or rales  Abd: soft, nontender, no hepatomegaly  Ext: no edema Musculoskeletal:  No deformities Skin: warm and dry  Neuro:  no focal abnormalities noted Psych:  Normal affect   EKG:  The EKG was personally reviewed and demonstrates:   #1 SR 73bpm, normal  intervals SR 62bpm, , QT is slightly longer 484 /QTc 496 SR 66bpm qt 524/QTc 549 Today is SR 67bpm,    Telemetry:  Telemetry was personally reviewed and demonstrates:   SR, no arrhythmias   Relevant CV Studies:  07/26/20: TTE IMPRESSIONS   1. Left ventricular ejection fraction, by estimation, is 60 to 65%. The  left ventricle has normal function. The left ventricle has no regional  wall motion abnormalities. Left ventricular diastolic parameters are  consistent with Grade I diastolic  dysfunction (impaired relaxation).   2. Right ventricular systolic function is normal. The right ventricular  size is normal.   3. The mitral valve is normal in structure. Trivial mitral valve  regurgitation. No evidence of mitral stenosis.   4. The aortic valve is tricuspid. Aortic valve regurgitation is not  visualized. No aortic stenosis is present.   5. The inferior vena cava is normal in size with greater than 50%  respiratory variability, suggesting right atrial pressure of 3 mmHg.     07/24/20: TTE IMPRESSIONS   1. Left ventricular ejection fraction, by estimation, is 60 to 65%. The  left ventricle has normal function. The left ventricle has no regional  wall motion abnormalities. Left ventricular diastolic parameters are  consistent with Grade I diastolic  dysfunction (impaired relaxation).   2. Right ventricular systolic function is  normal. The right ventricular  size is normal. There is normal pulmonary artery systolic pressure.   3. The mitral valve is normal in structure. No evidence of mitral valve  regurgitation. No evidence of mitral stenosis.   4. The aortic valve is tricuspid. Aortic valve regurgitation is not  visualized. No aortic stenosis is present.   5. The inferior vena cava is normal in size with greater than 50%  respiratory variability, suggesting right atrial pressure of 3 mmHg.   Laboratory Data:  High Sensitivity Troponin:   Recent Labs  Lab 07/23/20 2133 07/24/20 0001 07/24/20 0300  TROPONINIHS 193* 141* 65*     Chemistry Recent Labs  Lab 07/23/20 2133 07/23/20 2338 07/25/20 0757 07/26/20 0549  NA 134* 135 135 134*  K 3.9 3.9 3.7 4.2  CL 99  --  103 103  CO2 24  --  25 25  GLUCOSE 515*  --  296* 164*  BUN 14  --  16 16  CREATININE 0.57  --  0.50 0.50  CALCIUM 8.7*  --  8.8* 8.6*  GFRNONAA >60  --  >60 >60  ANIONGAP 11  --  7 6    Recent Labs  Lab 07/23/20 2133 07/25/20 0757  PROT 6.7 5.8*  ALBUMIN 3.5 2.8*  AST 35 27  ALT 41 30  ALKPHOS 117 80  BILITOT 0.5 0.8   Hematology Recent Labs  Lab 07/23/20 2133 07/23/20 2338  WBC 7.8  --   RBC 5.01  --   HGB 15.1* 15.0  HCT 43.2 44.0  MCV 86.2  --   MCH 30.1  --   MCHC 35.0  --   RDW 11.9  --   PLT 232  --    BNP Recent Labs  Lab 07/23/20 2133  BNP 85.1    DDimer No results for input(s): DDIMER in the last 168 hours.   Radiology/Studies:  DG Chest 2 View Result Date: 07/23/2020 CLINICAL DATA:  Syncope. EXAM: CHEST - 2 VIEW COMPARISON:  None. FINDINGS: The heart size and mediastinal contours are within normal limits. Vague nodular density within the right upper lobe measures approximately 1.5 cm  and is indeterminate. No pleural effusion, pulmonary edema or airspace consolidation. The visualized skeletal structures are unremarkable. IMPRESSION: 1. No acute cardiopulmonary abnormalities. 2. Indeterminate nodular  density within the right upper lobe. Consider further evaluation with nonemergent CT of the chest. Electronically Signed   By: Kerby Moors M.D.   On: 07/23/2020 21:54   CT Angio Chest PE W and/or Wo Contrast Result Date: 07/24/2020 CLINICAL DATA:  Pulmonary embolus suspected.  Syncope. EXAM: CT ANGIOGRAPHY CHEST WITH CONTRAST TECHNIQUE: Multidetector CT imaging of the chest was performed using the standard protocol during bolus administration of intravenous contrast. Multiplanar CT image reconstructions and MIPs were obtained to evaluate the vascular anatomy. CONTRAST:  27m OMNIPAQUE IOHEXOL 350 MG/ML SOLN COMPARISON:  Chest x-ray 07/23/2020 FINDINGS: Cardiovascular: Satisfactory opacification of the pulmonary arteries to the segmental level. No evidence of pulmonary embolism. The main pulmonary artery is normal in caliber. Normal heart size. No significant pericardial effusion. The thoracic aorta is normal in caliber. Trace atherosclerotic plaque of the thoracic aorta. No coronary artery calcifications. Mediastinum/Nodes: No enlarged mediastinal, hilar, or axillary lymph nodes. Thyroid gland, trachea, and esophagus demonstrate no significant findings. Lungs/Pleura: Bilateral lower lobe subsegmental atelectasis. No focal consolidation. Punctate calcified nodule within the left lower lobe. Punctate calcified nodule within the right upper lobe. No pulmonary mass. No pleural effusion. No pneumothorax. Upper Abdomen: No acute abnormality. Musculoskeletal: No chest wall abnormality. No suspicious lytic or blastic osseous lesions. No acute displaced fracture. Multilevel degenerative changes of the spine. Review of the MIP images confirms the above findings. IMPRESSION: 1. No pulmonary embolus. 2. No acute intrathoracic abnormality. Electronically Signed   By: MIven FinnM.D.   On: 07/24/2020 06:22     MR ANGIO HEAD WO CONTRAST Result Date: 07/25/2020 CLINICAL DATA:  Initial evaluation for syncope,  headache. EXAM: MRI HEAD WITHOUT AND WITH CONTRAST MRA HEAD WITHOUT CONTRAST MRA NECK WITHOUT AND WITH CONTRAST TECHNIQUE: Multiplanar, multi-echo pulse sequences of the brain and surrounding structures were acquired without intravenous contrast. Angiographic images of the Circle of Willis were acquired using MRA technique without intravenous contrast. Angiographic images of the neck were acquired using MRA technique without and with intravenous contrast. Carotid stenosis measurements (when applicable) are obtained utilizing NASCET criteria, using the distal internal carotid diameter as the denominator. CONTRAST:  410mGADAVIST GADOBUTROL 1 MMOL/ML IV SOLN COMPARISON:  None available. FINDINGS: MRI HEAD FINDINGS Brain: Cerebral volume within normal limits for age. Scattered patchy T2/FLAIR hyperintensity seen involving the periventricular, deep, and subcortical white matter both cerebral hemispheres, nonspecific, but most likely related to chronic microvascular ischemic disease, mild in nature. No abnormal foci of restricted diffusion to suggest acute or subacute ischemia. Gray-white matter differentiation maintained. No encephalomalacia to suggest chronic cortical infarction. No foci of susceptibility artifact to suggest acute or chronic intracranial hemorrhage. No mass lesion, midline shift or mass effect. No hydrocephalus or extra-axial fluid collection. Note made of an empty sella. Suprasellar region normal. Midline structures intact. No abnormal enhancement. Vascular: Major intracranial vascular flow voids are maintained. Skull and upper cervical spine: Craniocervical junction within normal limits. Bone marrow signal intensity normal. No scalp soft tissue abnormality. Sinuses/Orbits: Globes and orbital soft tissues within normal limits. Mild scattered mucosal thickening noted within the ethmoidal air cells and maxillary sinuses. Paranasal sinuses are otherwise clear. No mastoid effusion. Inner ear structures  grossly normal. Other: None. MRA HEAD FINDINGS Anterior circulation: Distal cervical segments of the internal carotid arteries are widely patent with antegrade flow. Petrous, cavernous, and supraclinoid segments  patent without stenosis or other abnormality. A1 segments patent bilaterally. Left A1 hypoplastic, accounting for the diminutive left ICA is compared to the right. Anterior communicating artery complex partially fenestrated bone otherwise unremarkable. Both ACAs widely patent to their distal aspects. No M1 stenosis or occlusion. Normal MCA bifurcations. Distal MCA branches well perfused and symmetric. Posterior circulation: Both vertebral arteries widely patent to the vertebrobasilar junction without stenosis. Right vertebral artery slightly dominant. Both PICA origins patent and normal. Basilar patent to its distal aspect without stenosis. Superior cerebellar arteries patent bilaterally. PCAs widely patent to their distal aspects without stenosis. Small bilateral posterior communicating arteries noted. Anatomic variants: Hypoplastic left A1 segment. Slightly dominant right vertebral artery. No aneurysm or other vascular abnormality. MRA NECK FINDINGS Aortic arch: Visualized aortic arch normal in caliber with normal 3 vessel morphology. No stenosis or other abnormality about the origin of the great vessels. Right carotid system: Right common and internal carotid arteries widely patent without stenosis, evidence for dissection or occlusion. Left carotid system: Left common and internal carotid arteries widely patent without stenosis, evidence for dissection or occlusion. Vertebral arteries: Evaluation of vertebral arteries mildly limited by motion. Both vertebral arteries arise from the subclavian arteries. No proximal subclavian artery stenosis. Both vertebral arteries widely patent without stenosis, evidence for dissection or occlusion. Other: None IMPRESSION: MRI HEAD: 1. No acute intracranial abnormality.  2. Mild patchy T2/FLAIR hyperintensities involving the supratentorial cerebral white matter, nonspecific, but most likely related to chronic microvascular ischemic disease, mild for age. 3. Empty sella. While this finding is often incidental in nature and of no clinical significance, this can also be seen in the setting of idiopathic intracranial hypertension. MRA HEAD: Normal intracranial MRA. No large vessel occlusion, hemodynamically significant stenosis, or other acute vascular abnormality. MRA NECK: Normal MRA of the neck. Electronically Signed   By: Jeannine Boga M.D.   On: 07/25/2020 20:08  o Assessment and Plan:   Syncope 2    prolonged QT  Her QT on arrival looked OK, she is not on any medicines at home or given any here to prolong QT. Unclear mechanism of her syncope Unclear why her QT is long.  Start nadolol AVOID ALL potential QT prolonging medicines Plan for 2 week monitor (ordered and will go to her home) F/u with Dr. Lovena Le (is in place)  Dr. Lovena Le has seen and examined the patient    Risk Assessment/Risk Scores:  { For questions or updates, please contact [facility] Please consult [email or web address] for contact info under    Signed, Baldwin [place], PA-C  07/26/2020 2:37 PM  EP Attending  Patient seen and examined. The history is obtained with the help of the [place] interpreter. She has been admitted with unexplained syncope. She has not had any arrhythmias since she has been in the hospital. Her QT was initially normal then prolonged. She has not had anginal symptoms. No QT prolonging drugs are admitted to.  She has atypical chest pain and her CT scan is negative. I would suggest she be given low dose beta blocker as her bp allows. We will consider cardiac monitoring. She should have a Takasubo CM but echo does not suggest this. I'll see her back in followup.   Carleene Overlie Trung Wenzl,MD

## 2020-07-27 LAB — GLUCOSE, CAPILLARY: Glucose-Capillary: 336 mg/dL — ABNORMAL HIGH (ref 70–99)

## 2020-07-27 MED ORDER — NADOLOL 20 MG PO TABS: 20.0000 mg | ORAL_TABLET | Freq: Every day | ORAL | 3 refills | Status: DC

## 2020-07-27 MED ORDER — INSULIN ASPART PROT & ASPART (70-30 MIX) 100 UNIT/ML ~~LOC~~ SUSP
14.0000 [IU] | Freq: Two times a day (BID) | SUBCUTANEOUS | Status: DC
  Administered 2020-07-27: 14 [IU] via SUBCUTANEOUS

## 2020-07-27 NOTE — Discharge Summary (Signed)
Physician Discharge Summary  [place] KZS:[id number] DOB: [birth year] DOA: 07/23/2020  PCP: Pcp, No  Admit date: 07/23/2020 Discharge date: 07/27/2020  Time spent: 36 minutes  Recommendations for Outpatient Follow-up:  New medications: Nadolol, 70/30 insulin, Amaryl Outpatient Chem-12, CBC in about 1 week Needs significant diabetic education in the outpatient setting Recommend retinopathy and nephropathy screening in the outpatient setting given her visual changes to be set up by her new PCP Recommend close follow-up with cardiology as patient going home on a 2-week monitor--?  Might need other prolonged Qtc work-up?  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 Radford Pax Developed increasing QTC 6/23 afternoon prompting input from EP which is pending Adrenal insufficiency ruled out Severe hyperglycemia better controlled on discharge   Hospital Course:   Syncope collapse DDx orthostatic hypotension, adrenal insufficiency-diagnosis unclear at this time-in the setting of prolonged QT?  Arrhythmia being main driver MRI brain 7/32 (ordered by cardiology rule out intracranial pathology) grossly negative-unlikely etiology to syncope Orthostatics now resolved--Echocardiogram shows no significant valvular  issue Echocardiogram actually was even repeated on 6/24 and was still normal ?  Adrenal insufficiency ruled out [a.m. cortisol at 10/17/2009 0.7 is equivocal--Short ACTH testing negative] Seen by electrophysiology who added nadolol and recommended 2-week monitor and outpatient close follow-up with Dr. Crissie Sickles, LVEDP who can then decide on further work-up (channellopathy versus other more rare causes?) Empty sella syndrome? With hormone levels reasonable [above] --no further work-up Elevated troponin Prolonged QtC Probable arrhythmia CT coronary suboptimal however discussed personally with Dr. 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) Start Amaryl 1 mg breakfast Lantus-->7030 insulin--increase to 14 units twice Close outpatient monitoring being set up with PCP needs screening labs in the outpatient settin  Discharge Exam: Vitals:   07/26/20 2100 07/27/20 0553  BP:  112/71  Pulse: 63 (!) 56  Resp: 16 16  Temp: 98.7 F (37.1 C) 98.6 F (37 C)  SpO2: 97% 94%    Subj on day of d/c   Awake alert coherent no distress pleasant has been giving herself insulin no other real complaints other than mild blurred vision  General Exam on discharge  EOMI NCAT frail cachectic female Chest clear no rales no rhonchi Abdomen soft no rebound or guarding No lower extremity edema ROM intact moving all 4 limbs equally   Discharge Instructions   Discharge Instructions     Diet - low sodium heart healthy   Complete by: As directed    Discharge instructions   Complete by: As directed    We have set you up with a primary care physician-make sure you follow-up with them You have been seen for heart abnormality and because of the abnormality you will need to be on some new medications  one of  them is called nadolol which has been called into [facility] and you will need to pick that up and take it as directed He will also need to follow-up with the cardiologist as per their instructions and they will be in touch with you You have been taught how to use insulin and you will need to use insulin twice a day, learn to use the meter and also take blood sugar medications Please keep a log of these blood sugars ensure with them to your primary care physician so adjustments can be made to your meds if needed Take good care of herself and good luck   Increase activity slowly   Complete by: As directed       Allergies as of 07/27/2020   No Known Allergies      Medication List     STOP taking these medications    metFORMIN 500 MG tablet Commonly known as: GLUCOPHAGE       TAKE these medications    Accu-Chek Guide test strip Generic drug: glucose blood Use segn las indicaciones. (Use as instructed)   Accu-Chek Guide w/Device Kit 1 each by Does not apply route 4 (four) times daily.   Accu-Chek Softclix Lancets lancets Use como se indica. (Use as directed.)   aspirin 325 MG tablet Take 1 tablet (325 mg total) by mouth daily.   atorvastatin 40 MG tablet Commonly known as: LIPITOR Tome 1 tableta (40 mg en total) por va oral diariamente. (Take 1 tablet (40 mg total) by mouth daily.)   glimepiride 2 MG tablet Commonly known as: AMARYL Tome 0.5 tableta ( 0.5 mg en total) por va oral diariamente con el desayuno. (Take 0.5 tablets (1 mg total) by mouth daily with breakfast.)   nadolol 20 MG tablet Commonly known as: CORGARD Take 1 tablet (20 mg total) by mouth daily.   NovoLOG Mix 70/30 (70-30) 100 UNIT/ML injection Generic drug: insulin aspart protamine- aspart Inject 0.14 mLs (14 Units total) into the skin 2 (two) times daily with a meal.   UltiCare Insulin Syringe 30G X 1/2" 0.3 ML Misc Generic drug: Insulin Syringe-Needle U-100 Use as directed with insulin        No Known Allergies  Follow-up Information     Imogene Burn, PA-C Follow up.   Specialty: Cardiology Why: [place] ago 17, 2022 11:15 AM (Llegar antes de 11:00 AM). Esta es una cita de seguimiento con [employer]. Contact information: [address] [place] [address] [place]. Go on 08/26/2020.   Why: at 1:55pm for hospital followup with Juluis Mire, NP Contact information: [place] [address] [phone]        Evans Lance, MD Follow up.   Specialty: Cardiology Why: 08/23/20 @ 4:15PM (follow up on heart monitor results) Contact information: [address] [place] [address] [phone]                  The results of significant diagnostics from this hospitalization (including imaging, microbiology, ancillary and laboratory) are listed below for reference.    Significant Diagnostic Studies: DG Chest 2 View  Result Date: 07/23/2020 CLINICAL DATA:  Syncope. EXAM: CHEST - 2 VIEW COMPARISON:  None. FINDINGS: The heart size and mediastinal contours are within normal limits. Vague nodular density within the right upper lobe measures approximately 1.5 cm and is indeterminate.  No pleural effusion, pulmonary edema or airspace consolidation. The visualized skeletal structures are unremarkable. IMPRESSION: 1. No acute cardiopulmonary abnormalities. 2. Indeterminate nodular density within the right upper lobe. Consider further evaluation with nonemergent CT of the chest. Electronically Signed   By: Kerby Moors M.D.   On: 07/23/2020 21:54   CT Angio Chest PE W and/or Wo Contrast  Result Date: 07/24/2020 CLINICAL DATA:  Pulmonary embolus suspected.  Syncope. EXAM: CT ANGIOGRAPHY CHEST WITH CONTRAST TECHNIQUE: Multidetector CT imaging of the chest was performed using the standard protocol during bolus  administration of intravenous contrast. Multiplanar CT image reconstructions and MIPs were obtained to evaluate the vascular anatomy. CONTRAST:  72m OMNIPAQUE IOHEXOL 350 MG/ML SOLN COMPARISON:  Chest x-ray 07/23/2020 FINDINGS: Cardiovascular: Satisfactory opacification of the pulmonary arteries to the segmental level. No evidence of pulmonary embolism. The main pulmonary artery is normal in caliber. Normal heart size. No significant pericardial effusion. The thoracic aorta is normal in caliber. Trace atherosclerotic plaque of the thoracic aorta. No coronary artery calcifications. Mediastinum/Nodes: No enlarged mediastinal, hilar, or axillary lymph nodes. Thyroid gland, trachea, and esophagus demonstrate no significant findings. Lungs/Pleura: Bilateral lower lobe subsegmental atelectasis. No focal consolidation. Punctate calcified nodule within the left lower lobe. Punctate calcified nodule within the right upper lobe. No pulmonary mass. No pleural effusion. No pneumothorax. Upper Abdomen: No acute abnormality. Musculoskeletal: No chest wall abnormality. No suspicious lytic or blastic osseous lesions. No acute displaced fracture. Multilevel degenerative changes of the spine. Review of the MIP images confirms the above findings. IMPRESSION: 1. No pulmonary embolus. 2. No acute intrathoracic abnormality. Electronically Signed   By: MIven FinnM.D.   On: 07/24/2020 06:22   MR ANGIO HEAD WO CONTRAST  Result Date: 07/25/2020 CLINICAL DATA:  Initial evaluation for syncope, headache. EXAM: MRI HEAD WITHOUT AND WITH CONTRAST MRA HEAD WITHOUT CONTRAST MRA NECK WITHOUT AND WITH CONTRAST TECHNIQUE: Multiplanar, multi-echo pulse sequences of the brain and surrounding structures were acquired without intravenous contrast. Angiographic images of the Circle of Willis were acquired using MRA technique without intravenous contrast. Angiographic images of the neck were acquired using MRA technique without and with  intravenous contrast. Carotid stenosis measurements (when applicable) are obtained utilizing NASCET criteria, using the distal internal carotid diameter as the denominator. CONTRAST:  467mGADAVIST GADOBUTROL 1 MMOL/ML IV SOLN COMPARISON:  None available. FINDINGS: MRI HEAD FINDINGS Brain: Cerebral volume within normal limits for age. Scattered patchy T2/FLAIR hyperintensity seen involving the periventricular, deep, and subcortical white matter both cerebral hemispheres, nonspecific, but most likely related to chronic microvascular ischemic disease, mild in nature. No abnormal foci of restricted diffusion to suggest acute or subacute ischemia. Gray-white matter differentiation maintained. No encephalomalacia to suggest chronic cortical infarction. No foci of susceptibility artifact to suggest acute or chronic intracranial hemorrhage. No mass lesion, midline shift or mass effect. No hydrocephalus or extra-axial fluid collection. Note made of an empty sella. Suprasellar region normal. Midline structures intact. No abnormal enhancement. Vascular: Major intracranial vascular flow voids are maintained. Skull and upper cervical spine: Craniocervical junction within normal limits. Bone marrow signal intensity normal. No scalp soft tissue abnormality. Sinuses/Orbits: Globes and orbital soft tissues within normal limits. Mild scattered mucosal thickening noted within the ethmoidal air cells and maxillary sinuses. Paranasal sinuses are otherwise clear. No mastoid effusion. Inner ear structures grossly normal. Other: None. MRA HEAD FINDINGS Anterior circulation: Distal cervical segments of the internal carotid arteries are widely patent with antegrade flow. Petrous, cavernous, and supraclinoid segments patent without stenosis or  other abnormality. A1 segments patent bilaterally. Left A1 hypoplastic, accounting for the diminutive left ICA is compared to the right. Anterior communicating artery complex partially fenestrated bone  otherwise unremarkable. Both ACAs widely patent to their distal aspects. No M1 stenosis or occlusion. Normal MCA bifurcations. Distal MCA branches well perfused and symmetric. Posterior circulation: Both vertebral arteries widely patent to the vertebrobasilar junction without stenosis. Right vertebral artery slightly dominant. Both PICA origins patent and normal. Basilar patent to its distal aspect without stenosis. Superior cerebellar arteries patent bilaterally. PCAs widely patent to their distal aspects without stenosis. Small bilateral posterior communicating arteries noted. Anatomic variants: Hypoplastic left A1 segment. Slightly dominant right vertebral artery. No aneurysm or other vascular abnormality. MRA NECK FINDINGS Aortic arch: Visualized aortic arch normal in caliber with normal 3 vessel morphology. No stenosis or other abnormality about the origin of the great vessels. Right carotid system: Right common and internal carotid arteries widely patent without stenosis, evidence for dissection or occlusion. Left carotid system: Left common and internal carotid arteries widely patent without stenosis, evidence for dissection or occlusion. Vertebral arteries: Evaluation of vertebral arteries mildly limited by motion. Both vertebral arteries arise from the subclavian arteries. No proximal subclavian artery stenosis. Both vertebral arteries widely patent without stenosis, evidence for dissection or occlusion. Other: None IMPRESSION: MRI HEAD: 1. No acute intracranial abnormality. 2. Mild patchy T2/FLAIR hyperintensities involving the supratentorial cerebral white matter, nonspecific, but most likely related to chronic microvascular ischemic disease, mild for age. 3. Empty sella. While this finding is often incidental in nature and of no clinical significance, this can also be seen in the setting of idiopathic intracranial hypertension. MRA HEAD: Normal intracranial MRA. No large vessel occlusion, hemodynamically  significant stenosis, or other acute vascular abnormality. MRA NECK: Normal MRA of the neck. Electronically Signed   By: Jeannine Boga M.D.   On: 07/25/2020 20:08   MR ANGIO NECK W WO CONTRAST  Result Date: 07/25/2020 CLINICAL DATA:  Initial evaluation for syncope, headache. EXAM: MRI HEAD WITHOUT AND WITH CONTRAST MRA HEAD WITHOUT CONTRAST MRA NECK WITHOUT AND WITH CONTRAST TECHNIQUE: Multiplanar, multi-echo pulse sequences of the brain and surrounding structures were acquired without intravenous contrast. Angiographic images of the Circle of Willis were acquired using MRA technique without intravenous contrast. Angiographic images of the neck were acquired using MRA technique without and with intravenous contrast. Carotid stenosis measurements (when applicable) are obtained utilizing NASCET criteria, using the distal internal carotid diameter as the denominator. CONTRAST:  54m GADAVIST GADOBUTROL 1 MMOL/ML IV SOLN COMPARISON:  None available. FINDINGS: MRI HEAD FINDINGS Brain: Cerebral volume within normal limits for age. Scattered patchy T2/FLAIR hyperintensity seen involving the periventricular, deep, and subcortical white matter both cerebral hemispheres, nonspecific, but most likely related to chronic microvascular ischemic disease, mild in nature. No abnormal foci of restricted diffusion to suggest acute or subacute ischemia. Gray-white matter differentiation maintained. No encephalomalacia to suggest chronic cortical infarction. No foci of susceptibility artifact to suggest acute or chronic intracranial hemorrhage. No mass lesion, midline shift or mass effect. No hydrocephalus or extra-axial fluid collection. Note made of an empty sella. Suprasellar region normal. Midline structures intact. No abnormal enhancement. Vascular: Major intracranial vascular flow voids are maintained. Skull and upper cervical spine: Craniocervical junction within normal limits. Bone marrow signal intensity normal. No  scalp soft tissue abnormality. Sinuses/Orbits: Globes and orbital soft tissues within normal limits. Mild scattered mucosal thickening noted within the ethmoidal air cells and maxillary sinuses. Paranasal sinuses are otherwise clear. No mastoid  effusion. Inner ear structures grossly normal. Other: None. MRA HEAD FINDINGS Anterior circulation: Distal cervical segments of the internal carotid arteries are widely patent with antegrade flow. Petrous, cavernous, and supraclinoid segments patent without stenosis or other abnormality. A1 segments patent bilaterally. Left A1 hypoplastic, accounting for the diminutive left ICA is compared to the right. Anterior communicating artery complex partially fenestrated bone otherwise unremarkable. Both ACAs widely patent to their distal aspects. No M1 stenosis or occlusion. Normal MCA bifurcations. Distal MCA branches well perfused and symmetric. Posterior circulation: Both vertebral arteries widely patent to the vertebrobasilar junction without stenosis. Right vertebral artery slightly dominant. Both PICA origins patent and normal. Basilar patent to its distal aspect without stenosis. Superior cerebellar arteries patent bilaterally. PCAs widely patent to their distal aspects without stenosis. Small bilateral posterior communicating arteries noted. Anatomic variants: Hypoplastic left A1 segment. Slightly dominant right vertebral artery. No aneurysm or other vascular abnormality. MRA NECK FINDINGS Aortic arch: Visualized aortic arch normal in caliber with normal 3 vessel morphology. No stenosis or other abnormality about the origin of the great vessels. Right carotid system: Right common and internal carotid arteries widely patent without stenosis, evidence for dissection or occlusion. Left carotid system: Left common and internal carotid arteries widely patent without stenosis, evidence for dissection or occlusion. Vertebral arteries: Evaluation of vertebral arteries mildly limited  by motion. Both vertebral arteries arise from the subclavian arteries. No proximal subclavian artery stenosis. Both vertebral arteries widely patent without stenosis, evidence for dissection or occlusion. Other: None IMPRESSION: MRI HEAD: 1. No acute intracranial abnormality. 2. Mild patchy T2/FLAIR hyperintensities involving the supratentorial cerebral white matter, nonspecific, but most likely related to chronic microvascular ischemic disease, mild for age. 3. Empty sella. While this finding is often incidental in nature and of no clinical significance, this can also be seen in the setting of idiopathic intracranial hypertension. MRA HEAD: Normal intracranial MRA. No large vessel occlusion, hemodynamically significant stenosis, or other acute vascular abnormality. MRA NECK: Normal MRA of the neck. Electronically Signed   By: Jeannine Boga M.D.   On: 07/25/2020 20:08   MR BRAIN W WO CONTRAST  Result Date: 07/25/2020 CLINICAL DATA:  Initial evaluation for syncope, headache. EXAM: MRI HEAD WITHOUT AND WITH CONTRAST MRA HEAD WITHOUT CONTRAST MRA NECK WITHOUT AND WITH CONTRAST TECHNIQUE: Multiplanar, multi-echo pulse sequences of the brain and surrounding structures were acquired without intravenous contrast. Angiographic images of the Circle of Willis were acquired using MRA technique without intravenous contrast. Angiographic images of the neck were acquired using MRA technique without and with intravenous contrast. Carotid stenosis measurements (when applicable) are obtained utilizing NASCET criteria, using the distal internal carotid diameter as the denominator. CONTRAST:  95m GADAVIST GADOBUTROL 1 MMOL/ML IV SOLN COMPARISON:  None available. FINDINGS: MRI HEAD FINDINGS Brain: Cerebral volume within normal limits for age. Scattered patchy T2/FLAIR hyperintensity seen involving the periventricular, deep, and subcortical white matter both cerebral hemispheres, nonspecific, but most likely related to  chronic microvascular ischemic disease, mild in nature. No abnormal foci of restricted diffusion to suggest acute or subacute ischemia. Gray-white matter differentiation maintained. No encephalomalacia to suggest chronic cortical infarction. No foci of susceptibility artifact to suggest acute or chronic intracranial hemorrhage. No mass lesion, midline shift or mass effect. No hydrocephalus or extra-axial fluid collection. Note made of an empty sella. Suprasellar region normal. Midline structures intact. No abnormal enhancement. Vascular: Major intracranial vascular flow voids are maintained. Skull and upper cervical spine: Craniocervical junction within normal limits. Bone marrow signal intensity normal.  No scalp soft tissue abnormality. Sinuses/Orbits: Globes and orbital soft tissues within normal limits. Mild scattered mucosal thickening noted within the ethmoidal air cells and maxillary sinuses. Paranasal sinuses are otherwise clear. No mastoid effusion. Inner ear structures grossly normal. Other: None. MRA HEAD FINDINGS Anterior circulation: Distal cervical segments of the internal carotid arteries are widely patent with antegrade flow. Petrous, cavernous, and supraclinoid segments patent without stenosis or other abnormality. A1 segments patent bilaterally. Left A1 hypoplastic, accounting for the diminutive left ICA is compared to the right. Anterior communicating artery complex partially fenestrated bone otherwise unremarkable. Both ACAs widely patent to their distal aspects. No M1 stenosis or occlusion. Normal MCA bifurcations. Distal MCA branches well perfused and symmetric. Posterior circulation: Both vertebral arteries widely patent to the vertebrobasilar junction without stenosis. Right vertebral artery slightly dominant. Both PICA origins patent and normal. Basilar patent to its distal aspect without stenosis. Superior cerebellar arteries patent bilaterally. PCAs widely patent to their distal aspects  without stenosis. Small bilateral posterior communicating arteries noted. Anatomic variants: Hypoplastic left A1 segment. Slightly dominant right vertebral artery. No aneurysm or other vascular abnormality. MRA NECK FINDINGS Aortic arch: Visualized aortic arch normal in caliber with normal 3 vessel morphology. No stenosis or other abnormality about the origin of the great vessels. Right carotid system: Right common and internal carotid arteries widely patent without stenosis, evidence for dissection or occlusion. Left carotid system: Left common and internal carotid arteries widely patent without stenosis, evidence for dissection or occlusion. Vertebral arteries: Evaluation of vertebral arteries mildly limited by motion. Both vertebral arteries arise from the subclavian arteries. No proximal subclavian artery stenosis. Both vertebral arteries widely patent without stenosis, evidence for dissection or occlusion. Other: None IMPRESSION: MRI HEAD: 1. No acute intracranial abnormality. 2. Mild patchy T2/FLAIR hyperintensities involving the supratentorial cerebral white matter, nonspecific, but most likely related to chronic microvascular ischemic disease, mild for age. 3. Empty sella. While this finding is often incidental in nature and of no clinical significance, this can also be seen in the setting of idiopathic intracranial hypertension. MRA HEAD: Normal intracranial MRA. No large vessel occlusion, hemodynamically significant stenosis, or other acute vascular abnormality. MRA NECK: Normal MRA of the neck. Electronically Signed   By: Jeannine Boga M.D.   On: 07/25/2020 20:08   CT CORONARY MORPH W/CTA COR W/SCORE W/CA W/CM &/OR WO/CM  Addendum Date: 07/25/2020   ADDENDUM REPORT: 07/25/2020 14:52 EXAM: OVER-READ INTERPRETATION  CT CHEST The following report is an over-read performed by radiologist Dr. Alvino Blood [facility] on 07/25/2020. This over-read does not include interpretation of  cardiac or coronary anatomy or pathology. The CTA interpretation by the cardiologist is attached. COMPARISON:  None. FINDINGS: Limited view of the lung parenchyma demonstrates no suspicious nodularity. Airways are normal. Limited view of the mediastinum demonstrates no adenopathy. Esophagus normal. Limited view of the upper abdomen unremarkable. Limited view of the skeleton and chest wall is unremarkable. IMPRESSION: No significant extracardiac findings. Electronically Signed   By: Suzy Bouchard M.D.   On: 07/25/2020 14:52   Result Date: 07/25/2020 CLINICAL DATA:  81F with chest pain EXAM: Cardiac/Coronary CTA TECHNIQUE: The patient was scanned on a [employer]. FINDINGS: A 100 kV prospective scan was triggered in the descending thoracic aorta at 111 HU's. Axial non-contrast 3 mm slices were carried out through the heart. The data set was analyzed on a dedicated work station and scored using the [employer]. Gantry rotation speed was 250 msecs and collimation was .6 mm.  0.8 mg of sl NTG was given. The 3D data set was reconstructed in 5% intervals of the 35-75 % of the R-R cycle. Phases were analyzed on a dedicated work station using MPR, MIP and VRT modes. The patient received 80 cc of contrast. Coronary Arteries:  Normal coronary origin.  Right dominance. RCA is a large dominant artery that gives rise to PDA and PLA. There is no plaque. Distal RCA and PDA not visualized as field of view did not include the bottom of her heart on CTA (likely due to difference in breathing betweem calcium score and CTA). No calcified plaque seen in this region on calcium score. Left main is a large artery that gives rise to LAD and LCX arteries. LAD is a large vessel that has no plaque. LCX is a non-dominant artery that gives rise to one large OM1 branch. There is no plaque. Other findings: Left Ventricle: Normal size Left Atrium: Normal size Pulmonary Veins: Normal configuration Right Ventricle: Normal size Right  Atrium: Normal size Cardiac valves: No calcifications Thoracic aorta: Normal size Pulmonary Arteries: Normal size Systemic Veins: Normal drainage Pericardium: Normal thickness IMPRESSION: 1. Coronary calcium score of 0. 2. Normal coronary origin with right dominance. 3. No evidence of CAD. Distal RCA and PDA not visualized on CTA as field of view did not include the bottom of her heart (likely due to difference in patient's breathing between calcium score and CTA). No calcified plaque seen in these arteries on calcium score, but cannot exclude noncalcified plaque in distal RCA/PDA CAD-RADS 0 (in visualized arteries). No evidence of CAD (0%). Consider non-atherosclerotic causes of chest pain. Electronically Signed: By: Oswaldo Milian MD On: 07/25/2020 12:28   ECHOCARDIOGRAM COMPLETE  Result Date: 07/24/2020    ECHOCARDIOGRAM REPORT   Patient Name:   Kimberly Fry Date of Exam: 07/24/2020 Medical Rec #:  [id number]               Height: Accession #:    [id number]              Weight: Date of Birth:  07-05-1965               BSA: Patient Age:    86 years                BP:           79/37 mmHg Patient Gender: F                       HR:           69 bpm. Exam Location:  Inpatient Procedure: 2D Echo Indications:    R07.9* Chest pain, unspecified  History:        Patient has no prior history of Echocardiogram examinations.                 Risk Factors:Diabetes.  Sonographer:    Jonelle Sidle Dance Referring Phys: [id number] [place]  1. Left ventricular ejection fraction, by estimation, is 60 to 65%. The left ventricle has normal function. The left ventricle has no regional wall motion abnormalities. Left ventricular diastolic parameters are consistent with Grade I diastolic dysfunction (impaired relaxation).  2. Right ventricular systolic function is normal. The right ventricular size is normal. There is normal pulmonary artery systolic pressure.  3. The mitral valve is normal in structure.  No evidence of mitral valve regurgitation. No evidence of mitral stenosis.  4. The aortic valve  is tricuspid. Aortic valve regurgitation is not visualized. No aortic stenosis is present.  5. The inferior vena cava is normal in size with greater than 50% respiratory variability, suggesting right atrial pressure of 3 mmHg. FINDINGS  Left Ventricle: Left ventricular ejection fraction, by estimation, is 60 to 65%. The left ventricle has normal function. The left ventricle has no regional wall motion abnormalities. The left ventricular internal cavity size was normal in size. There is  no left ventricular hypertrophy. Left ventricular diastolic parameters are consistent with Grade I diastolic dysfunction (impaired relaxation). Normal left ventricular filling pressure. Right Ventricle: The right ventricular size is normal. No increase in right ventricular wall thickness. Right ventricular systolic function is normal. There is normal pulmonary artery systolic pressure. The tricuspid regurgitant velocity is 1.63 m/s, and  with an assumed right atrial pressure of 3 mmHg, the estimated right ventricular systolic pressure is 34.2 mmHg. Left Atrium: Left atrial size was normal in size. Right Atrium: Right atrial size was normal in size. Pericardium: There is no evidence of pericardial effusion. Mitral Valve: The mitral valve is normal in structure. No evidence of mitral valve regurgitation. No evidence of mitral valve stenosis. Tricuspid Valve: The tricuspid valve is normal in structure. Tricuspid valve regurgitation is trivial. No evidence of tricuspid stenosis. Aortic Valve: The aortic valve is tricuspid. Aortic valve regurgitation is not visualized. No aortic stenosis is present. Pulmonic Valve: The pulmonic valve was normal in structure. Pulmonic valve regurgitation is not visualized. No evidence of pulmonic stenosis. Aorta: The aortic root is normal in size and structure. Venous: The inferior vena cava is normal in size  with greater than 50% respiratory variability, suggesting right atrial pressure of 3 mmHg. IAS/Shunts: No atrial level shunt detected by color flow Doppler.  LEFT VENTRICLE PLAX 2D LVIDd:         4.30 cm  Diastology LVIDs:         2.50 cm  LV e' medial:    5.47 cm/s LV PW:         0.80 cm  LV E/e' medial:  8.2 LV IVS:        0.60 cm  LV e' lateral:   8.08 cm/s LVOT diam:     1.70 cm  LV E/e' lateral: 5.6 LV SV:         36 LVOT Area:     2.27 cm  RIGHT VENTRICLE            IVC RV Basal diam:  2.30 cm    IVC diam: 1.10 cm RV S prime:     9.32 cm/s TAPSE (M-mode): 1.6 cm LEFT ATRIUM             RIGHT ATRIUM LA diam:        3.00 cm RA Area:     7.15 cm LA Vol (A2C):   27.7 ml RA Volume:   11.00 ml LA Vol (A4C):   19.2 ml LA Biplane Vol: 24.5 ml  AORTIC VALVE LVOT Vmax:   91.90 cm/s LVOT Vmean:  59.600 cm/s LVOT VTI:    0.157 m  AORTA Ao Root diam: 3.10 cm Ao Asc diam:  3.30 cm MITRAL VALVE               TRICUSPID VALVE MV Area (PHT): 3.48 cm    TR Peak grad:   10.6 mmHg MV Decel Time: 218 msec    TR Vmax:        163.00 cm/s MV E velocity: 44.90  cm/s MV A velocity: 74.40 cm/s  SHUNTS MV E/A ratio:  0.60        Systemic VTI:  0.16 m                            Systemic Diam: 1.70 cm Skeet Latch MD Electronically signed by Skeet Latch MD Signature Date/Time: 07/24/2020/4:21:57 PM    Final    ECHOCARDIOGRAM LIMITED  Result Date: 07/26/2020    ECHOCARDIOGRAM LIMITED REPORT   Patient Name:   Kimberly Fry Date of Exam: 07/26/2020 Medical Rec #:  [id number]               Height:       64.0 in Accession #:    [id number]              Weight:       90.0 lb Date of Birth:  [birth year]               BSA:          1.393 m Patient Age:    32 years                BP:           107/68 mmHg Patient Gender: F                       HR:           64 bpm. Exam Location:  Inpatient Procedure: 2D Echo, Cardiac Doppler and Color Doppler Indications:    Abnormal ECG  History:        Patient has prior history of  Echocardiogram examinations, most                 recent 07/24/2020. Risk Factors:Diabetes.  Sonographer:    Cammy Brochure Referring Phys: [place]  1. Left ventricular ejection fraction, by estimation, is 60 to 65%. The left ventricle has normal function. The left ventricle has no regional wall motion abnormalities. Left ventricular diastolic parameters are consistent with Grade I diastolic dysfunction (impaired relaxation).  2. Right ventricular systolic function is normal. The right ventricular size is normal.  3. The mitral valve is normal in structure. Trivial mitral valve regurgitation. No evidence of mitral stenosis.  4. The aortic valve is tricuspid. Aortic valve regurgitation is not visualized. No aortic stenosis is present.  5. The inferior vena cava is normal in size with greater than 50% respiratory variability, suggesting right atrial pressure of 3 mmHg. FINDINGS  Left Ventricle: Left ventricular ejection fraction, by estimation, is 60 to 65%. The left ventricle has normal function. The left ventricle has no regional wall motion abnormalities. The left ventricular internal cavity size was normal in size. There is  no left ventricular hypertrophy. Left ventricular diastolic parameters are consistent with Grade I diastolic dysfunction (impaired relaxation). Right Ventricle: The right ventricular size is normal. Right ventricular systolic function is normal. Left Atrium: Left atrial size was normal in size. Right Atrium: Right atrial size was normal in size. Pericardium: There is no evidence of pericardial effusion. Mitral Valve: The mitral valve is normal in structure. Trivial mitral valve regurgitation. No evidence of mitral valve stenosis. Tricuspid Valve: The tricuspid valve is normal in structure. Tricuspid valve regurgitation is trivial. No evidence of tricuspid stenosis. Aortic Valve: The aortic valve is tricuspid. Aortic valve regurgitation is not visualized. No aortic  stenosis  is present. Aortic valve mean gradient measures 5.0 mmHg. Aortic valve peak gradient measures 8.9 mmHg. Aortic valve area, by VTI measures 1.93 cm. Pulmonic Valve: The pulmonic valve was normal in structure. Pulmonic valve regurgitation is not visualized. No evidence of pulmonic stenosis. Aorta: The aortic root is normal in size and structure. Venous: The inferior vena cava is normal in size with greater than 50% respiratory variability, suggesting right atrial pressure of 3 mmHg. IAS/Shunts: No atrial level shunt detected by color flow Doppler. LEFT VENTRICLE PLAX 2D LVIDd:         3.90 cm  Diastology LVIDs:         2.10 cm  LV e' medial:    7.07 cm/s LV PW:         0.80 cm  LV E/e' medial:  14.1 LV IVS:        1.00 cm  LV e' lateral:   8.49 cm/s LVOT diam:     1.70 cm  LV E/e' lateral: 11.7 LV SV:         61 LV SV Index:   44 LVOT Area:     2.27 cm  RIGHT VENTRICLE             IVC RV Basal diam:  2.60 cm     IVC diam: 1.40 cm RV S prime:     14.80 cm/s LEFT ATRIUM             Index       RIGHT ATRIUM          Index LA diam:        3.30 cm 2.37 cm/m  RA Area:     9.08 cm LA Vol (A2C):   36.3 ml 26.05 ml/m RA Volume:   15.20 ml 10.91 ml/m LA Vol (A4C):   31.5 ml 22.61 ml/m LA Biplane Vol: 34.6 ml 24.83 ml/m  AORTIC VALVE AV Area (Vmax):    1.89 cm AV Area (Vmean):   1.86 cm AV Area (VTI):     1.93 cm AV Vmax:           149.00 cm/s AV Vmean:          100.000 cm/s AV VTI:            0.316 m AV Peak Grad:      8.9 mmHg AV Mean Grad:      5.0 mmHg LVOT Vmax:         124.00 cm/s LVOT Vmean:        81.900 cm/s LVOT VTI:          0.269 m LVOT/AV VTI ratio: 0.85  AORTA Ao Root diam: 3.00 cm MITRAL VALVE               TRICUSPID VALVE MV Area (PHT): 3.91 cm    TR Peak grad:   19.9 mmHg MV Decel Time: 194 msec    TR Vmax:        223.00 cm/s MV E velocity: 99.40 cm/s MV A velocity: 87.90 cm/s  SHUNTS MV E/A ratio:  1.13        Systemic VTI:  0.27 m                            Systemic Diam: 1.70 cm Kirk Ruths MD Electronically signed by Kirk Ruths MD Signature Date/Time: 07/26/2020/9:38:13 AM    Final     Microbiology: Recent Results (from the  past 240 hour(s))  SARS CORONAVIRUS 2 (TAT 6-24 HRS) Nasopharyngeal Nasopharyngeal Swab     Status: None   Collection Time: 07/24/20 12:07 PM   Specimen: Nasopharyngeal Swab  Result Value Ref Range Status   SARS Coronavirus 2 NEGATIVE NEGATIVE Final    Comment: (NOTE) SARS-CoV-2 target nucleic acids are NOT DETECTED.  The SARS-CoV-2 RNA is generally detectable in upper and lower respiratory specimens during the acute phase of infection. Negative results do not preclude SARS-CoV-2 infection, do not rule out co-infections with other pathogens, and should not be used as the sole basis for treatment or other patient management decisions. Negative results must be combined with clinical observations, patient history, and epidemiological information. The expected result is Negative.  Fact Sheet for Patients: SugarRoll.be  Fact Sheet for Healthcare Providers: https://www.woods-mathews.com/  This test is not yet approved or cleared by the [place] FDA and  has been authorized for detection and/or diagnosis of SARS-CoV-2 by FDA under an Emergency Use Authorization (EUA). This EUA will remain  in effect (meaning this test can be used) for the duration of the COVID-19 declaration under Se ction 564(b)(1) of the Act, 21 U.S.C. section 360bbb-3(b)(1), unless the authorization is terminated or revoked sooner.  Performed at [facility], [place] [address], [place], [place] [address]      Labs: Basic Metabolic Panel: Recent Labs  Lab 07/23/20 2133 07/23/20 2338 07/25/20 0757 07/26/20 0549  NA 134* 135 135 134*  K 3.9 3.9 3.7 4.2  CL 99  --  103 103  CO2 24  --  25 25  GLUCOSE 515*  --  296* 164*  BUN 14  --  16 16  CREATININE 0.57  --  0.50 0.50  CALCIUM 8.7*  --  8.8* 8.6*  MG  --    --  1.7 1.9   Liver Function Tests: Recent Labs  Lab 07/23/20 2133 07/25/20 0757  AST 35 27  ALT 41 30  ALKPHOS 117 80  BILITOT 0.5 0.8  PROT 6.7 5.8*  ALBUMIN 3.5 2.8*   No results for input(s): LIPASE, AMYLASE in the last 168 hours. No results for input(s): AMMONIA in the last 168 hours. CBC: Recent Labs  Lab 07/23/20 2133 07/23/20 2338  WBC 7.8  --   NEUTROABS 4.6  --   HGB 15.1* 15.0  HCT 43.2 44.0  MCV 86.2  --   PLT 232  --    Cardiac Enzymes: No results for input(s): CKTOTAL, CKMB, CKMBINDEX, TROPONINI in the last 168 hours. BNP: BNP (last 3 results) Recent Labs    07/23/20 2133  BNP 85.1    ProBNP (last 3 results) No results for input(s): PROBNP in the last 8760 hours.  CBG: Recent Labs  Lab 07/26/20 0817 07/26/20 1147 07/26/20 1708 07/26/20 2118 07/27/20 0738  GLUCAP 328* 319* 183* 294* 336*       Signed:  Nita Sells MD   [facility] 07/27/2020, 9:43 AM

## 2020-07-27 NOTE — Progress Notes (Signed)
Progress Note  Patient Name: Kimberly Fry Date of Encounter: 07/27/2020  [facility] Cardiologist: Dr. Mayford Knife  Subjective   Reports continues to have very mild chest pain but improved.  Does report some lightheadedness but denies any further syncope.  Inpatient Medications    Scheduled Meds:  aspirin  325 mg Oral Daily   atorvastatin  40 mg Oral Daily   enoxaparin (LOVENOX) injection  40 mg Subcutaneous Q24H   glimepiride  1 mg Oral Q breakfast   insulin aspart  0-15 Units Subcutaneous TID AC & HS   insulin aspart  3 Units Subcutaneous TID WC   insulin aspart protamine- aspart  14 Units Subcutaneous BID WC   nadolol  20 mg Oral Daily   Continuous Infusions:  PRN Meds: acetaminophen, nitroGLYCERIN, polyethylene glycol   Vital Signs    Vitals:   07/26/20 0939 07/26/20 1719 07/26/20 2100 07/27/20 0553  BP: 117/68 108/68  112/71  Pulse: 70 64 63 (!) 56  Resp: 16 18 16 16   Temp: 99 F (37.2 C) 98.9 F (37.2 C) 98.7 F (37.1 C) 98.6 F (37 C)  TempSrc: Oral Oral Oral Oral  SpO2: 99% 98% 97% 94%  Weight:      Height:        Intake/Output Summary (Last 24 hours) at 07/27/2020 1105 Last data filed at 07/26/2020 1900 Gross per 24 hour  Intake 480 ml  Output --  Net 480 ml    Last 3 Weights 07/24/2020  Weight (lbs) 90 lb  Weight (kg) 40.824 kg      Telemetry    Has been disconnected from telemetry, unable to review- Personally Reviewed  ECG    No new ECG tracing today. - Personally Reviewed  Physical Exam   GEN: Well nourished, well developed in no acute distress HEENT: Normal NECK: No JVD; No carotid bruits LYMPHATICS: No lymphadenopathy CARDIAC:RRR, no murmurs, rubs, gallops RESPIRATORY:  Clear to auscultation without rales, wheezing or rhonchi  ABDOMEN: Soft, non-tender, non-distended MUSCULOSKELETAL:  No edema; No deformity  SKIN: Warm and dry NEUROLOGIC:  Alert and oriented x 3 PSYCHIATRIC:  Normal affect   Labs    High  Sensitivity Troponin:   Recent Labs  Lab 07/23/20 2133 07/24/20 0001 07/24/20 0300  TROPONINIHS 193* 141* 65*       Chemistry Recent Labs  Lab 07/23/20 2133 07/23/20 2338 07/25/20 0757 07/26/20 0549  NA 134* 135 135 134*  K 3.9 3.9 3.7 4.2  CL 99  --  103 103  CO2 24  --  25 25  GLUCOSE 515*  --  296* 164*  BUN 14  --  16 16  CREATININE 0.57  --  0.50 0.50  CALCIUM 8.7*  --  8.8* 8.6*  PROT 6.7  --  5.8*  --   ALBUMIN 3.5  --  2.8*  --   AST 35  --  27  --   ALT 41  --  30  --   ALKPHOS 117  --  80  --   BILITOT 0.5  --  0.8  --   GFRNONAA >60  --  >60 >60  ANIONGAP 11  --  7 6      Hematology Recent Labs  Lab 07/23/20 2133 07/23/20 2338  WBC 7.8  --   RBC 5.01  --   HGB 15.1* 15.0  HCT 43.2 44.0  MCV 86.2  --   MCH 30.1  --   MCHC 35.0  --  RDW 11.9  --   PLT 232  --      BNP Recent Labs  Lab 07/23/20 2133  BNP 85.1      DDimer No results for input(s): DDIMER in the last 168 hours.   Radiology    MR ANGIO HEAD WO CONTRAST  Result Date: 07/25/2020 CLINICAL DATA:  Initial evaluation for syncope, headache. EXAM: MRI HEAD WITHOUT AND WITH CONTRAST MRA HEAD WITHOUT CONTRAST MRA NECK WITHOUT AND WITH CONTRAST TECHNIQUE: Multiplanar, multi-echo pulse sequences of the brain and surrounding structures were acquired without intravenous contrast. Angiographic images of the Circle of Willis were acquired using MRA technique without intravenous contrast. Angiographic images of the neck were acquired using MRA technique without and with intravenous contrast. Carotid stenosis measurements (when applicable) are obtained utilizing NASCET criteria, using the distal internal carotid diameter as the denominator. CONTRAST:  4mL GADAVIST GADOBUTROL 1 MMOL/ML IV SOLN COMPARISON:  None available. FINDINGS: MRI HEAD FINDINGS Brain: Cerebral volume within normal limits for age. Scattered patchy T2/FLAIR hyperintensity seen involving the periventricular, deep, and  subcortical white matter both cerebral hemispheres, nonspecific, but most likely related to chronic microvascular ischemic disease, mild in nature. No abnormal foci of restricted diffusion to suggest acute or subacute ischemia. Gray-white matter differentiation maintained. No encephalomalacia to suggest chronic cortical infarction. No foci of susceptibility artifact to suggest acute or chronic intracranial hemorrhage. No mass lesion, midline shift or mass effect. No hydrocephalus or extra-axial fluid collection. Note made of an empty sella. Suprasellar region normal. Midline structures intact. No abnormal enhancement. Vascular: Major intracranial vascular flow voids are maintained. Skull and upper cervical spine: Craniocervical junction within normal limits. Bone marrow signal intensity normal. No scalp soft tissue abnormality. Sinuses/Orbits: Globes and orbital soft tissues within normal limits. Mild scattered mucosal thickening noted within the ethmoidal air cells and maxillary sinuses. Paranasal sinuses are otherwise clear. No mastoid effusion. Inner ear structures grossly normal. Other: None. MRA HEAD FINDINGS Anterior circulation: Distal cervical segments of the internal carotid arteries are widely patent with antegrade flow. Petrous, cavernous, and supraclinoid segments patent without stenosis or other abnormality. A1 segments patent bilaterally. Left A1 hypoplastic, accounting for the diminutive left ICA is compared to the right. Anterior communicating artery complex partially fenestrated bone otherwise unremarkable. Both ACAs widely patent to their distal aspects. No M1 stenosis or occlusion. Normal MCA bifurcations. Distal MCA branches well perfused and symmetric. Posterior circulation: Both vertebral arteries widely patent to the vertebrobasilar junction without stenosis. Right vertebral artery slightly dominant. Both PICA origins patent and normal. Basilar patent to its distal aspect without stenosis.  Superior cerebellar arteries patent bilaterally. PCAs widely patent to their distal aspects without stenosis. Small bilateral posterior communicating arteries noted. Anatomic variants: Hypoplastic left A1 segment. Slightly dominant right vertebral artery. No aneurysm or other vascular abnormality. MRA NECK FINDINGS Aortic arch: Visualized aortic arch normal in caliber with normal 3 vessel morphology. No stenosis or other abnormality about the origin of the great vessels. Right carotid system: Right common and internal carotid arteries widely patent without stenosis, evidence for dissection or occlusion. Left carotid system: Left common and internal carotid arteries widely patent without stenosis, evidence for dissection or occlusion. Vertebral arteries: Evaluation of vertebral arteries mildly limited by motion. Both vertebral arteries arise from the subclavian arteries. No proximal subclavian artery stenosis. Both vertebral arteries widely patent without stenosis, evidence for dissection or occlusion. Other: None IMPRESSION: MRI HEAD: 1. No acute intracranial abnormality. 2. Mild patchy T2/FLAIR hyperintensities involving the supratentorial cerebral white matter,  nonspecific, but most likely related to chronic microvascular ischemic disease, mild for age. 3. Empty sella. While this finding is often incidental in nature and of no clinical significance, this can also be seen in the setting of idiopathic intracranial hypertension. MRA HEAD: Normal intracranial MRA. No large vessel occlusion, hemodynamically significant stenosis, or other acute vascular abnormality. MRA NECK: Normal MRA of the neck. Electronically Signed   By: Rise Mu M.D.   On: 07/25/2020 20:08   MR ANGIO NECK W WO CONTRAST  Result Date: 07/25/2020 CLINICAL DATA:  Initial evaluation for syncope, headache. EXAM: MRI HEAD WITHOUT AND WITH CONTRAST MRA HEAD WITHOUT CONTRAST MRA NECK WITHOUT AND WITH CONTRAST TECHNIQUE: Multiplanar,  multi-echo pulse sequences of the brain and surrounding structures were acquired without intravenous contrast. Angiographic images of the Circle of Willis were acquired using MRA technique without intravenous contrast. Angiographic images of the neck were acquired using MRA technique without and with intravenous contrast. Carotid stenosis measurements (when applicable) are obtained utilizing NASCET criteria, using the distal internal carotid diameter as the denominator. CONTRAST:  10mL GADAVIST GADOBUTROL 1 MMOL/ML IV SOLN COMPARISON:  None available. FINDINGS: MRI HEAD FINDINGS Brain: Cerebral volume within normal limits for age. Scattered patchy T2/FLAIR hyperintensity seen involving the periventricular, deep, and subcortical white matter both cerebral hemispheres, nonspecific, but most likely related to chronic microvascular ischemic disease, mild in nature. No abnormal foci of restricted diffusion to suggest acute or subacute ischemia. Gray-white matter differentiation maintained. No encephalomalacia to suggest chronic cortical infarction. No foci of susceptibility artifact to suggest acute or chronic intracranial hemorrhage. No mass lesion, midline shift or mass effect. No hydrocephalus or extra-axial fluid collection. Note made of an empty sella. Suprasellar region normal. Midline structures intact. No abnormal enhancement. Vascular: Major intracranial vascular flow voids are maintained. Skull and upper cervical spine: Craniocervical junction within normal limits. Bone marrow signal intensity normal. No scalp soft tissue abnormality. Sinuses/Orbits: Globes and orbital soft tissues within normal limits. Mild scattered mucosal thickening noted within the ethmoidal air cells and maxillary sinuses. Paranasal sinuses are otherwise clear. No mastoid effusion. Inner ear structures grossly normal. Other: None. MRA HEAD FINDINGS Anterior circulation: Distal cervical segments of the internal carotid arteries are widely  patent with antegrade flow. Petrous, cavernous, and supraclinoid segments patent without stenosis or other abnormality. A1 segments patent bilaterally. Left A1 hypoplastic, accounting for the diminutive left ICA is compared to the right. Anterior communicating artery complex partially fenestrated bone otherwise unremarkable. Both ACAs widely patent to their distal aspects. No M1 stenosis or occlusion. Normal MCA bifurcations. Distal MCA branches well perfused and symmetric. Posterior circulation: Both vertebral arteries widely patent to the vertebrobasilar junction without stenosis. Right vertebral artery slightly dominant. Both PICA origins patent and normal. Basilar patent to its distal aspect without stenosis. Superior cerebellar arteries patent bilaterally. PCAs widely patent to their distal aspects without stenosis. Small bilateral posterior communicating arteries noted. Anatomic variants: Hypoplastic left A1 segment. Slightly dominant right vertebral artery. No aneurysm or other vascular abnormality. MRA NECK FINDINGS Aortic arch: Visualized aortic arch normal in caliber with normal 3 vessel morphology. No stenosis or other abnormality about the origin of the great vessels. Right carotid system: Right common and internal carotid arteries widely patent without stenosis, evidence for dissection or occlusion. Left carotid system: Left common and internal carotid arteries widely patent without stenosis, evidence for dissection or occlusion. Vertebral arteries: Evaluation of vertebral arteries mildly limited by motion. Both vertebral arteries arise from the subclavian arteries. No proximal subclavian  artery stenosis. Both vertebral arteries widely patent without stenosis, evidence for dissection or occlusion. Other: None IMPRESSION: MRI HEAD: 1. No acute intracranial abnormality. 2. Mild patchy T2/FLAIR hyperintensities involving the supratentorial cerebral white matter, nonspecific, but most likely related to  chronic microvascular ischemic disease, mild for age. 3. Empty sella. While this finding is often incidental in nature and of no clinical significance, this can also be seen in the setting of idiopathic intracranial hypertension. MRA HEAD: Normal intracranial MRA. No large vessel occlusion, hemodynamically significant stenosis, or other acute vascular abnormality. MRA NECK: Normal MRA of the neck. Electronically Signed   By: Rise Mu M.D.   On: 07/25/2020 20:08   MR BRAIN W WO CONTRAST  Result Date: 07/25/2020 CLINICAL DATA:  Initial evaluation for syncope, headache. EXAM: MRI HEAD WITHOUT AND WITH CONTRAST MRA HEAD WITHOUT CONTRAST MRA NECK WITHOUT AND WITH CONTRAST TECHNIQUE: Multiplanar, multi-echo pulse sequences of the brain and surrounding structures were acquired without intravenous contrast. Angiographic images of the Circle of Willis were acquired using MRA technique without intravenous contrast. Angiographic images of the neck were acquired using MRA technique without and with intravenous contrast. Carotid stenosis measurements (when applicable) are obtained utilizing NASCET criteria, using the distal internal carotid diameter as the denominator. CONTRAST:  4mL GADAVIST GADOBUTROL 1 MMOL/ML IV SOLN COMPARISON:  None available. FINDINGS: MRI HEAD FINDINGS Brain: Cerebral volume within normal limits for age. Scattered patchy T2/FLAIR hyperintensity seen involving the periventricular, deep, and subcortical white matter both cerebral hemispheres, nonspecific, but most likely related to chronic microvascular ischemic disease, mild in nature. No abnormal foci of restricted diffusion to suggest acute or subacute ischemia. Gray-white matter differentiation maintained. No encephalomalacia to suggest chronic cortical infarction. No foci of susceptibility artifact to suggest acute or chronic intracranial hemorrhage. No mass lesion, midline shift or mass effect. No hydrocephalus or extra-axial fluid  collection. Note made of an empty sella. Suprasellar region normal. Midline structures intact. No abnormal enhancement. Vascular: Major intracranial vascular flow voids are maintained. Skull and upper cervical spine: Craniocervical junction within normal limits. Bone marrow signal intensity normal. No scalp soft tissue abnormality. Sinuses/Orbits: Globes and orbital soft tissues within normal limits. Mild scattered mucosal thickening noted within the ethmoidal air cells and maxillary sinuses. Paranasal sinuses are otherwise clear. No mastoid effusion. Inner ear structures grossly normal. Other: None. MRA HEAD FINDINGS Anterior circulation: Distal cervical segments of the internal carotid arteries are widely patent with antegrade flow. Petrous, cavernous, and supraclinoid segments patent without stenosis or other abnormality. A1 segments patent bilaterally. Left A1 hypoplastic, accounting for the diminutive left ICA is compared to the right. Anterior communicating artery complex partially fenestrated bone otherwise unremarkable. Both ACAs widely patent to their distal aspects. No M1 stenosis or occlusion. Normal MCA bifurcations. Distal MCA branches well perfused and symmetric. Posterior circulation: Both vertebral arteries widely patent to the vertebrobasilar junction without stenosis. Right vertebral artery slightly dominant. Both PICA origins patent and normal. Basilar patent to its distal aspect without stenosis. Superior cerebellar arteries patent bilaterally. PCAs widely patent to their distal aspects without stenosis. Small bilateral posterior communicating arteries noted. Anatomic variants: Hypoplastic left A1 segment. Slightly dominant right vertebral artery. No aneurysm or other vascular abnormality. MRA NECK FINDINGS Aortic arch: Visualized aortic arch normal in caliber with normal 3 vessel morphology. No stenosis or other abnormality about the origin of the great vessels. Right carotid system: Right  common and internal carotid arteries widely patent without stenosis, evidence for dissection or occlusion. Left carotid system: Left  common and internal carotid arteries widely patent without stenosis, evidence for dissection or occlusion. Vertebral arteries: Evaluation of vertebral arteries mildly limited by motion. Both vertebral arteries arise from the subclavian arteries. No proximal subclavian artery stenosis. Both vertebral arteries widely patent without stenosis, evidence for dissection or occlusion. Other: None IMPRESSION: MRI HEAD: 1. No acute intracranial abnormality. 2. Mild patchy T2/FLAIR hyperintensities involving the supratentorial cerebral white matter, nonspecific, but most likely related to chronic microvascular ischemic disease, mild for age. 3. Empty sella. While this finding is often incidental in nature and of no clinical significance, this can also be seen in the setting of idiopathic intracranial hypertension. MRA HEAD: Normal intracranial MRA. No large vessel occlusion, hemodynamically significant stenosis, or other acute vascular abnormality. MRA NECK: Normal MRA of the neck. Electronically Signed   By: Rise Mu M.D.   On: 07/25/2020 20:08   CT CORONARY MORPH W/CTA COR W/SCORE W/CA W/CM &/OR WO/CM  Addendum Date: 07/25/2020   ADDENDUM REPORT: 07/25/2020 14:52 EXAM: OVER-READ INTERPRETATION  CT CHEST The following report is an over-read performed by radiologist Dr. Maryelizabeth Rowan [facility] on 07/25/2020. This over-read does not include interpretation of cardiac or coronary anatomy or pathology. The CTA interpretation by the cardiologist is attached. COMPARISON:  None. FINDINGS: Limited view of the lung parenchyma demonstrates no suspicious nodularity. Airways are normal. Limited view of the mediastinum demonstrates no adenopathy. Esophagus normal. Limited view of the upper abdomen unremarkable. Limited view of the skeleton and chest wall is unremarkable.  IMPRESSION: No significant extracardiac findings. Electronically Signed   By: Genevive Bi M.D.   On: 07/25/2020 14:52   Result Date: 07/25/2020 CLINICAL DATA:  27F with chest pain EXAM: Cardiac/Coronary CTA TECHNIQUE: The patient was scanned on a [employer]. FINDINGS: A 100 kV prospective scan was triggered in the descending thoracic aorta at 111 HU's. Axial non-contrast 3 mm slices were carried out through the heart. The data set was analyzed on a dedicated work station and scored using the Agatson method. Gantry rotation speed was 250 msecs and collimation was .6 mm. 0.8 mg of sl NTG was given. The 3D data set was reconstructed in 5% intervals of the 35-75 % of the R-R cycle. Phases were analyzed on a dedicated work station using MPR, MIP and VRT modes. The patient received 80 cc of contrast. Coronary Arteries:  Normal coronary origin.  Right dominance. RCA is a large dominant artery that gives rise to PDA and PLA. There is no plaque. Distal RCA and PDA not visualized as field of view did not include the bottom of her heart on CTA (likely due to difference in breathing betweem calcium score and CTA). No calcified plaque seen in this region on calcium score. Left main is a large artery that gives rise to LAD and LCX arteries. LAD is a large vessel that has no plaque. LCX is a non-dominant artery that gives rise to one large OM1 branch. There is no plaque. Other findings: Left Ventricle: Normal size Left Atrium: Normal size Pulmonary Veins: Normal configuration Right Ventricle: Normal size Right Atrium: Normal size Cardiac valves: No calcifications Thoracic aorta: Normal size Pulmonary Arteries: Normal size Systemic Veins: Normal drainage Pericardium: Normal thickness IMPRESSION: 1. Coronary calcium score of 0. 2. Normal coronary origin with right dominance. 3. No evidence of CAD. Distal RCA and PDA not visualized on CTA as field of view did not include the bottom of her heart (likely due to  difference in patient's breathing between calcium  score and CTA). No calcified plaque seen in these arteries on calcium score, but cannot exclude noncalcified plaque in distal RCA/PDA CAD-RADS 0 (in visualized arteries). No evidence of CAD (0%). Consider non-atherosclerotic causes of chest pain. Electronically Signed: By: Epifanio Lesches MD On: 07/25/2020 12:28   ECHOCARDIOGRAM LIMITED  Result Date: 07/26/2020    ECHOCARDIOGRAM LIMITED REPORT   Patient Name:   Madilyne JESUS PARADA PARADA Date of Exam: 07/26/2020 Medical Rec #:  [id number]               Height:       64.0 in Accession #:    [id number]              Weight:       90.0 lb Date of Birth:  03/11/1965               BSA:          1.393 m Patient Age:    55 years                BP:           107/68 mmHg Patient Gender: F                       HR:           64 bpm. Exam Location:  Inpatient Procedure: 2D Echo, Cardiac Doppler and Color Doppler Indications:    Abnormal ECG  History:        Patient has prior history of Echocardiogram examinations, most                 recent 07/24/2020. Risk Factors:Diabetes.  Sonographer:    Shirlean Kelly Referring Phys: [phone] TRACI R TURNER IMPRESSIONS  1. Left ventricular ejection fraction, by estimation, is 60 to 65%. The left ventricle has normal function. The left ventricle has no regional wall motion abnormalities. Left ventricular diastolic parameters are consistent with Grade I diastolic dysfunction (impaired relaxation).  2. Right ventricular systolic function is normal. The right ventricular size is normal.  3. The mitral valve is normal in structure. Trivial mitral valve regurgitation. No evidence of mitral stenosis.  4. The aortic valve is tricuspid. Aortic valve regurgitation is not visualized. No aortic stenosis is present.  5. The inferior vena cava is normal in size with greater than 50% respiratory variability, suggesting right atrial pressure of 3 mmHg. FINDINGS  Left Ventricle: Left ventricular  ejection fraction, by estimation, is 60 to 65%. The left ventricle has normal function. The left ventricle has no regional wall motion abnormalities. The left ventricular internal cavity size was normal in size. There is  no left ventricular hypertrophy. Left ventricular diastolic parameters are consistent with Grade I diastolic dysfunction (impaired relaxation). Right Ventricle: The right ventricular size is normal. Right ventricular systolic function is normal. Left Atrium: Left atrial size was normal in size. Right Atrium: Right atrial size was normal in size. Pericardium: There is no evidence of pericardial effusion. Mitral Valve: The mitral valve is normal in structure. Trivial mitral valve regurgitation. No evidence of mitral valve stenosis. Tricuspid Valve: The tricuspid valve is normal in structure. Tricuspid valve regurgitation is trivial. No evidence of tricuspid stenosis. Aortic Valve: The aortic valve is tricuspid. Aortic valve regurgitation is not visualized. No aortic stenosis is present. Aortic valve mean gradient measures 5.0 mmHg. Aortic valve peak gradient measures 8.9 mmHg. Aortic valve area, by VTI measures 1.93 cm. Pulmonic Valve: The pulmonic  valve was normal in structure. Pulmonic valve regurgitation is not visualized. No evidence of pulmonic stenosis. Aorta: The aortic root is normal in size and structure. Venous: The inferior vena cava is normal in size with greater than 50% respiratory variability, suggesting right atrial pressure of 3 mmHg. IAS/Shunts: No atrial level shunt detected by color flow Doppler. LEFT VENTRICLE PLAX 2D LVIDd:         3.90 cm  Diastology LVIDs:         2.10 cm  LV e' medial:    7.07 cm/s LV PW:         0.80 cm  LV E/e' medial:  14.1 LV IVS:        1.00 cm  LV e' lateral:   8.49 cm/s LVOT diam:     1.70 cm  LV E/e' lateral: 11.7 LV SV:         61 LV SV Index:   44 LVOT Area:     2.27 cm  RIGHT VENTRICLE             IVC RV Basal diam:  2.60 cm     IVC diam: 1.40 cm  RV S prime:     14.80 cm/s LEFT ATRIUM             Index       RIGHT ATRIUM          Index LA diam:        3.30 cm 2.37 cm/m  RA Area:     9.08 cm LA Vol (A2C):   36.3 ml 26.05 ml/m RA Volume:   15.20 ml 10.91 ml/m LA Vol (A4C):   31.5 ml 22.61 ml/m LA Biplane Vol: 34.6 ml 24.83 ml/m  AORTIC VALVE AV Area (Vmax):    1.89 cm AV Area (Vmean):   1.86 cm AV Area (VTI):     1.93 cm AV Vmax:           149.00 cm/s AV Vmean:          100.000 cm/s AV VTI:            0.316 m AV Peak Grad:      8.9 mmHg AV Mean Grad:      5.0 mmHg LVOT Vmax:         124.00 cm/s LVOT Vmean:        81.900 cm/s LVOT VTI:          0.269 m LVOT/AV VTI ratio: 0.85  AORTA Ao Root diam: 3.00 cm MITRAL VALVE               TRICUSPID VALVE MV Area (PHT): 3.91 cm    TR Peak grad:   19.9 mmHg MV Decel Time: 194 msec    TR Vmax:        223.00 cm/s MV E velocity: 99.40 cm/s MV A velocity: 87.90 cm/s  SHUNTS MV E/A ratio:  1.13        Systemic VTI:  0.27 m                            Systemic Diam: 1.70 cm Olga Millers MD Electronically signed by Olga Millers MD Signature Date/Time: 07/26/2020/9:38:13 AM    Final     Cardiac Studies   Echocardiogram 07/24/2020: Impressions:  1. Left ventricular ejection fraction, by estimation, is 60 to 65%. The  left ventricle has normal function. The left ventricle has no regional  wall motion abnormalities. Left ventricular diastolic parameters are  consistent with Grade I diastolic  dysfunction (impaired relaxation).   2. Right ventricular systolic function is normal. The right ventricular  size is normal. There is normal pulmonary artery systolic pressure.   3. The mitral valve is normal in structure. No evidence of mitral valve  regurgitation. No evidence of mitral stenosis.   4. The aortic valve is tricuspid. Aortic valve regurgitation is not  visualized. No aortic stenosis is present.   5. The inferior vena cava is normal in size with greater than 50%  respiratory variability, suggesting  right atrial pressure of 3 mmHg.   Patient Profile     55 y.o. Spanish speaking female with a history of type 2 diabetes mellitus and medical noncompliance who is being seen for evaluation of  chest pain and syncope at the request of Dr. Mahala Menghini.  Assessment & Plan    Chest Pain - Occurred in the setting of learning her mother had suddenly died. - High-sensitivity troponin peaked at 193. - Chest CTA negative for PE.  - Echo showed LVEF of 60-65% with normal wall motion. - No recurrent chest pain. -coronary CTA showed no coronary ca and no CAD although the distal RCA and PDA were not visualized but there was no Ca in this area   Prolonged QTc -she developed QT prolongation with diffuse ST abnormality ? Etiology.   -Seen by EP, recommend weekly monitor and follow-up with Dr. Ladona Ridgel  Syncope - Possibly vasovagal after receiving news that her mother died.  - Orthostatic negative but baseline BP is low (systolic BP in the 90s). - Echo showed normal LV function with no significant valvular disease.  - coronary CTA with no CAD - Will also need monitor at discharge.  Type 2 Diabetes  - Blood glucose 515 on admission. Currently not on medical therapy at home. - Hemoglobin A1c pending. - Management per primary team.  [facility] will sign off.   Medication Recommendations:  No changes, avoid QT proloning meds Other recommendations (labs, testing, etc):  Zio patch x 2 weeks on discharge (ordered by EP) Follow up as an outpatient:  Plan f/u with Dr Ladona Ridgel    For questions or updates, please contact [facility] Please consult [email or web address] for contact info under        Signed, Little Ishikawa, MD  07/27/2020, 11:05 AM

## 2020-08-23 ENCOUNTER — Ambulatory Visit: Payer: Self-pay | Admitting: Internal Medicine

## 2020-08-26 ENCOUNTER — Inpatient Hospital Stay (INDEPENDENT_AMBULATORY_CARE_PROVIDER_SITE_OTHER): Payer: Self-pay | Admitting: Primary Care

## 2020-09-18 ENCOUNTER — Ambulatory Visit: Payer: Self-pay | Admitting: Physician Assistant

## 2020-10-18 ENCOUNTER — Ambulatory Visit: Payer: Self-pay | Admitting: Internal Medicine

## 2021-04-22 ENCOUNTER — Encounter: Payer: Self-pay | Admitting: Orthopaedic Surgery

## 2022-06-19 ENCOUNTER — Emergency Department (HOSPITAL_COMMUNITY)
Admission: EM | Admit: 2022-06-19 | Discharge: 2022-06-19 | Disposition: A | Discharge status: Home / Self Care | Payer: Self-pay | Attending: Emergency Medicine | Admitting: Emergency Medicine

## 2022-06-19 ENCOUNTER — Emergency Department (HOSPITAL_COMMUNITY): Payer: Self-pay

## 2022-06-19 ENCOUNTER — Other Ambulatory Visit: Payer: Self-pay

## 2022-06-19 ENCOUNTER — Encounter (HOSPITAL_COMMUNITY): Payer: Self-pay

## 2022-06-19 DIAGNOSIS — E119 Type 2 diabetes mellitus without complications: Secondary | ICD-10-CM | POA: Insufficient documentation

## 2022-06-19 DIAGNOSIS — Z7982 Long term (current) use of aspirin: Secondary | ICD-10-CM | POA: Insufficient documentation

## 2022-06-19 DIAGNOSIS — J168 Pneumonia due to other specified infectious organisms: Secondary | ICD-10-CM | POA: Insufficient documentation

## 2022-06-19 DIAGNOSIS — Z794 Long term (current) use of insulin: Secondary | ICD-10-CM | POA: Insufficient documentation

## 2022-06-19 DIAGNOSIS — Z1152 Encounter for screening for COVID-19: Secondary | ICD-10-CM | POA: Insufficient documentation

## 2022-06-19 DIAGNOSIS — Z7984 Long term (current) use of oral hypoglycemic drugs: Secondary | ICD-10-CM | POA: Insufficient documentation

## 2022-06-19 DIAGNOSIS — J189 Pneumonia, unspecified organism: Secondary | ICD-10-CM

## 2022-06-19 LAB — RESP PANEL BY RT-PCR (RSV, FLU A&B, COVID)  RVPGX2
Influenza A by PCR: NEGATIVE
Influenza B by PCR: NEGATIVE
Resp Syncytial Virus by PCR: NEGATIVE
SARS Coronavirus 2 by RT PCR: NEGATIVE

## 2022-06-19 MED ORDER — ALBUTEROL SULFATE HFA 108 (90 BASE) MCG/ACT IN AERS: 1.0000 | INHALATION_SPRAY | Freq: Four times a day (QID) | RESPIRATORY_TRACT | Status: DC | PRN

## 2022-06-19 MED ORDER — AMOXICILLIN-POT CLAVULANATE 875-125 MG PO TABS: 1.0000 | ORAL_TABLET | Freq: Two times a day (BID) | ORAL | 0 refills | Status: DC

## 2022-06-19 MED ORDER — ALBUTEROL SULFATE HFA 108 (90 BASE) MCG/ACT IN AERS
2.0000 | INHALATION_SPRAY | RESPIRATORY_TRACT | Status: DC | PRN
  Administered 2022-06-19: 2 via RESPIRATORY_TRACT
  Filled 2022-06-19: qty 6.7

## 2022-06-19 MED ORDER — BENZONATATE 100 MG PO CAPS: 100.0000 mg | ORAL_CAPSULE | Freq: Three times a day (TID) | ORAL | 0 refills | Status: DC

## 2022-06-19 MED ORDER — DOXYCYCLINE HYCLATE 100 MG PO CAPS: 100.0000 mg | ORAL_CAPSULE | Freq: Two times a day (BID) | ORAL | 0 refills | Status: DC

## 2022-06-19 MED ORDER — GUAIFENESIN ER 600 MG PO TB12: 600.0000 mg | ORAL_TABLET | Freq: Two times a day (BID) | ORAL | 0 refills | Status: DC

## 2022-06-19 NOTE — ED Triage Notes (Signed)
Pt arrives c/o cough and SOB x1 week. Also states fever/chills, generalized body aches, sore throat, nasal congestion. States that the pain is worse when she coughs.  Spanish interpreter used for triage process.

## 2022-06-19 NOTE — Discharge Instructions (Signed)
Please use the nebulizer as needed, and the Mucinex to help kind of bring up kind of sputum from your cough.  Use Tessalon Perles at night, if the cough is really bothering you.  Additionally you should take the antibiotic as prescribed, return to ER if you have worsening cough, shortness of breath, back pain.

## 2022-06-19 NOTE — ED Provider Notes (Signed)
[facility] Provider Note   CSN: [id number] Arrival date & time: 06/19/22  4696     History  Chief Complaint  Patient presents with   Shortness of Breath   Cough    Kimberly Fry is a 57 y.o. female, history of diabetes, who presents to the ED secondary to shortness of breath, cough, fever, chills, body aches has been going on for the last 2 weeks.  She notes that she just feels just generally unwell, and she has some back pain, when she coughs.  She states that she has been eating and drinking okay, and is still making urine.  Reports that she does have slight reduced appetite but still having good p.o. intake.  Sugars have been good and she has been compliant with her medications.     Home Medications Prior to Admission medications   Medication Sig Start Date End Date Taking? Authorizing Provider  amoxicillin-clavulanate (AUGMENTIN) 875-125 MG tablet Take 1 tablet by mouth every 12 (twelve) hours. 06/19/22  Yes Skyler Carel L, PA  benzonatate (TESSALON) 100 MG capsule Take 1 capsule (100 mg total) by mouth every 8 (eight) hours. 06/19/22  Yes Hersh Minney L, PA  doxycycline (VIBRAMYCIN) 100 MG capsule Take 1 capsule (100 mg total) by mouth 2 (two) times daily. 06/19/22  Yes Rafi Kenneth L, PA  guaiFENesin (MUCINEX) 600 MG 12 hr tablet Take 1 tablet (600 mg total) by mouth 2 (two) times daily. 06/19/22  Yes Rykin Route L, PA  Accu-Chek Softclix Lancets lancets Use as directed. 07/26/20   Rhetta Mura, MD  aspirin 325 MG tablet Take 1 tablet (325 mg total) by mouth daily. 07/27/20   Rhetta Mura, MD  atorvastatin (LIPITOR) 40 MG tablet Take 1 tablet (40 mg total) by mouth daily. 07/27/20   Rhetta Mura, MD  Blood Glucose Monitoring Suppl (ACCU-CHEK GUIDE) w/Device KIT 1 each by Does not apply route 4 (four) times daily. 07/26/20   Rhetta Mura, MD  glimepiride (AMARYL) 2 MG tablet Take 0.5 tablets (1  mg total) by mouth daily with breakfast. 07/27/20   Rhetta Mura, MD  glucose blood (ACCU-CHEK GUIDE) test strip Use as instructed 07/26/20   Rhetta Mura, MD  insulin aspart protamine- aspart (NOVOLOG MIX 70/30) (70-30) 100 UNIT/ML injection Inject 0.14 mLs (14 Units total) into the skin 2 (two) times daily with a meal. 07/26/20   Rhetta Mura, MD  Insulin Syringe-Needle U-100 30G X 1/2" 0.3 ML MISC Use as directed with insulin 07/26/20   Rhetta Mura, MD  nadolol (CORGARD) 20 MG tablet Take 1 tablet (20 mg total) by mouth daily. 07/27/20   Rhetta Mura, MD      Allergies    Patient has no known allergies.    Review of Systems   Review of Systems  Constitutional:  Positive for fatigue and fever.  Respiratory:  Positive for cough and shortness of breath.   Cardiovascular:  Negative for chest pain.    Physical Exam Updated Vital Signs BP (!) 149/89 (BP Location: Right Arm)   Pulse 80   Temp 99.1 F (37.3 C) (Oral)   Resp 20   SpO2 98%  Physical Exam Vitals and nursing note reviewed.  Constitutional:      General: She is not in acute distress.    Appearance: She is well-developed.  HENT:     Head: Normocephalic and atraumatic.  Eyes:     Conjunctiva/sclera: Conjunctivae normal.  Cardiovascular:  Rate and Rhythm: Normal rate and regular rhythm.     Heart sounds: No murmur heard. Pulmonary:     Effort: Pulmonary effort is normal. No respiratory distress.     Breath sounds: Examination of the right-upper field reveals rales. Rales present.  Abdominal:     Palpations: Abdomen is soft.     Tenderness: There is no abdominal tenderness.  Musculoskeletal:        General: No swelling.     Cervical back: Neck supple.  Skin:    General: Skin is warm and dry.     Capillary Refill: Capillary refill takes less than 2 seconds.  Neurological:     Mental Status: She is alert.  Psychiatric:        Mood and Affect: Mood normal.     ED Results /  Procedures / Treatments   Labs (all labs ordered are listed, but only abnormal results are displayed) Labs Reviewed  RESP PANEL BY RT-PCR (RSV, FLU A&B, COVID)  [id number]    EKG EKG Interpretation  Date/Time:  Friday Jun 19 2022 05:58:47 EDT Ventricular Rate:  81 PR Interval:  138 QRS Duration: 83 QT Interval:  388 QTC Calculation: 451 R Axis:   38 Text Interpretation: Sinus rhythm Borderline T wave abnormalities Otherwise no significant change Confirmed by Drema Pry [phone]) on 06/19/2022 6:05:35 AM  Radiology DG Chest 2 View  Result Date: 06/19/2022 CLINICAL DATA:  57 year old female with history of shortness of breath, cough and wheezing. EXAM: CHEST - 2 VIEW COMPARISON:  Chest x-ray 08/22/2020. FINDINGS: Lung volumes are normal. No consolidative airspace disease. No pleural effusions. No pneumothorax. No pulmonary nodule or mass noted. Pulmonary vasculature and the cardiomediastinal silhouette are within normal limits. Atherosclerosis in the thoracic aorta. IMPRESSION: 1.  No radiographic evidence of acute cardiopulmonary disease. 2. Aortic atherosclerosis. Electronically Signed   By: Trudie Reed M.D.   On: 06/19/2022 06:48    Procedures Procedures    Medications Ordered in ED Medications  albuterol (VENTOLIN HFA) 108 (90 Base) MCG/ACT inhaler 2 puff (2 puffs Inhalation Given 06/19/22 0559)  albuterol (VENTOLIN HFA) 108 (90 Base) MCG/ACT inhaler 1-2 puff (has no administration in time range)    ED Course/ Medical Decision Making/ A&P                             Medical Decision Making Patient is a 57 year old female, she has rales in her right upper lobe, and she complains of cough, fever, fatigue, and shortness of breath for the last 2 weeks.  I am suspicious that she may have pneumonia.  We will obtain a chest x-ray, viral panel for further evaluation.  She is overall eating well, and she states her glucose has been stable thus we will hold off on obtaining blood work  at this time.  Amount and/or Complexity of Data Reviewed Labs:     Details: COVID/flu negative Radiology:     Details: Chest x-ray is clear, no evidence of pneumonia Discussion of management or test interpretation with external provider(s): Although chest x-ray is clear, given her rales, symptoms, I believe that she may have pneumonia, that is early on, we will treat her with Augmentin, doxycycline, Mucinex, and give her an inhaler to help with this.  We discussed the importance of following up with a primary care doctor, and return if she is not improving within 48 to 72 hours.  We discussed return precautions and she voiced understanding  discharged home.  Risk OTC drugs. Prescription drug management.    Final Clinical Impression(s) / ED Diagnoses Final diagnoses:  Pneumonia of right upper lobe due to infectious organism    Rx / DC Orders ED Discharge Orders          Ordered    amoxicillin-clavulanate (AUGMENTIN) 875-125 MG tablet  Every 12 hours        06/19/22 0712    doxycycline (VIBRAMYCIN) 100 MG capsule  2 times daily        06/19/22 0712    benzonatate (TESSALON) 100 MG capsule  Every 8 hours        06/19/22 0712    guaiFENesin (MUCINEX) 600 MG 12 hr tablet  2 times daily        06/19/22 0712              Jahree Dermody Elbert Ewings, PA 06/19/22 0715    Sloan Leiter, DO 06/19/22 0745

## 2022-11-22 IMAGING — CT CT HEART MORP W/ CTA COR W/ SCORE W/ CA W/CM &/OR W/O CM
4 of 7 series · 8 of 20 positions shown, 9 images · non-contrast
Comparison: None.

Addendum:
CLINICAL DATA: 55F with chest pain

EXAM:
Cardiac/Coronary CTA
TECHNIQUE: The patient was scanned on a [employer] Force scanner.

[Series 6: best diast 76 % · axial · 0.32mm/px · z∈[+1117,+1151]mm · 2 of 256 slices shown]
[im 86/256  vessel]
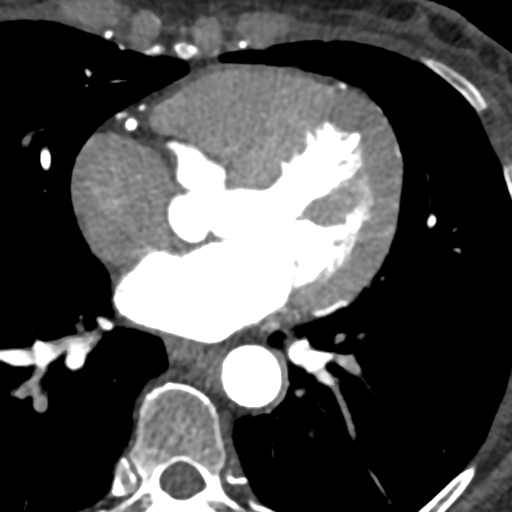
[im 171/256  vessel]
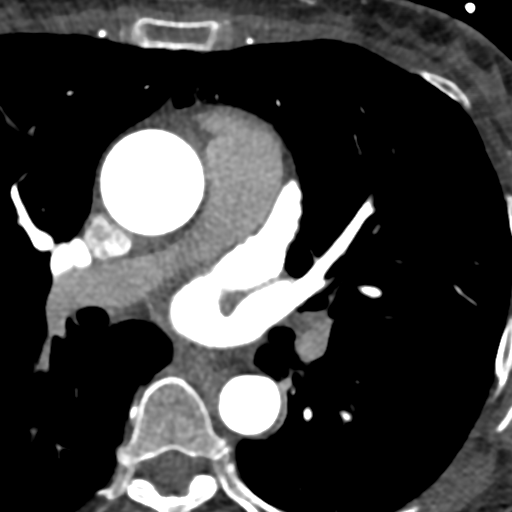

[Series 7: best syst · axial · 0.32mm/px · z∈[+1117,+1151]mm · 2 of 256 slices shown, 3 images]
[im 86/256  vessel]
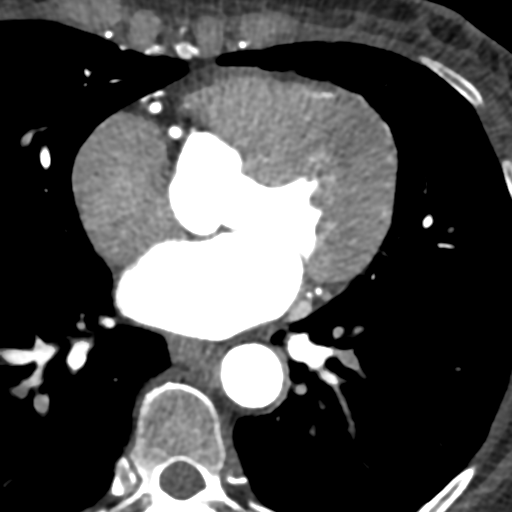
[im 86/256  lung]
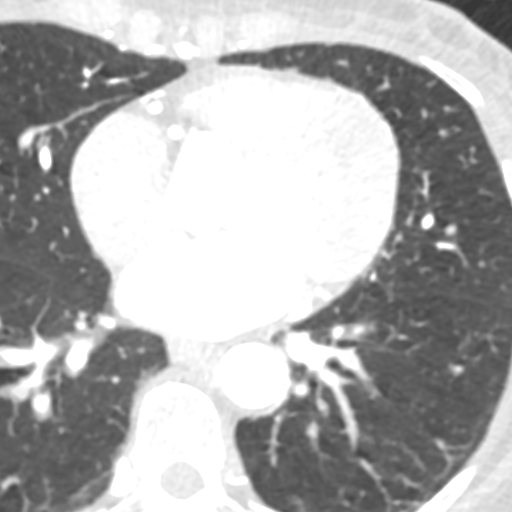
[im 171/256  vessel]
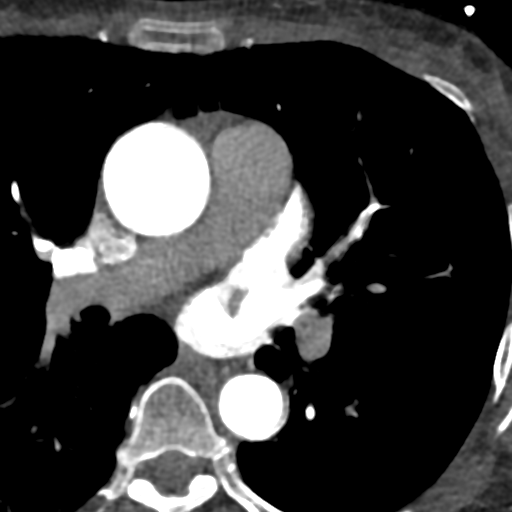

[Series 8: ts diast sharp 76 % · axial · 0.32mm/px · z∈[+1117,+1151]mm · 2 of 256 slices shown]
[im 86/256  lung]
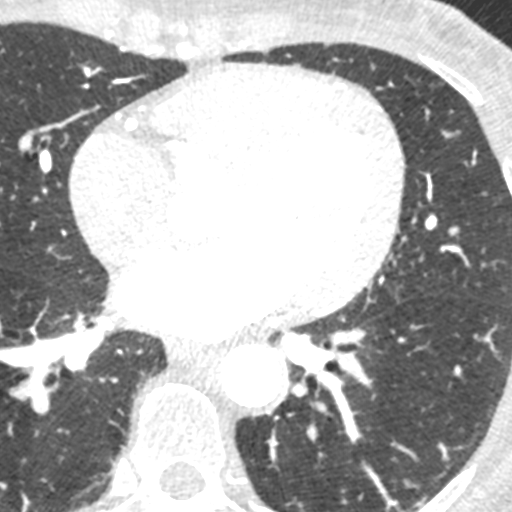
[im 171/256  lung]
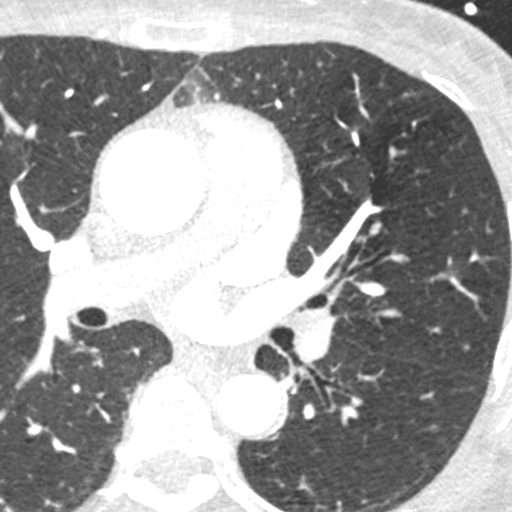

[Series 9: ts syst sharp 39 % · axial · 0.32mm/px · z∈[+1117,+1151]mm · 2 of 256 slices shown]
[im 86/256  lung]
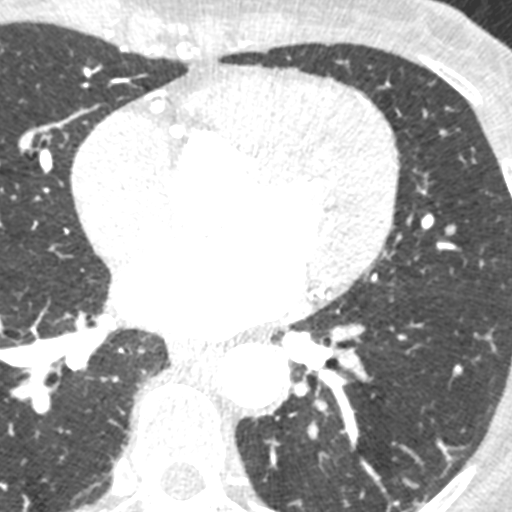
[im 171/256  lung]
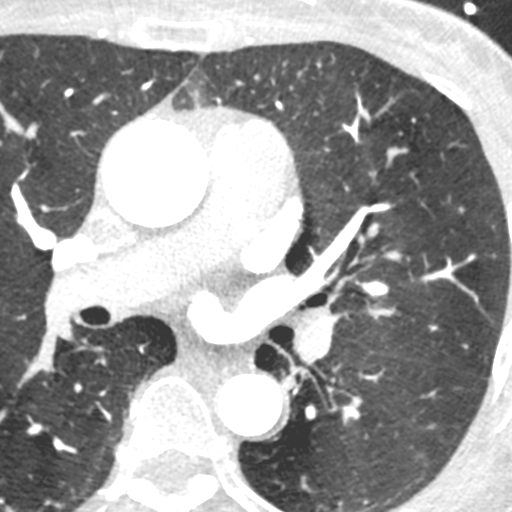

[8 of 20 positions shown; findings below may reference images not displayed]

FINDINGS: A 100 kV prospective scan was triggered in the descending thoracic
aorta at 111 HU's. Axial non-contrast 3 mm slices were carried out
through the heart. The data set was analyzed on a dedicated work
station and scored using the Agatson method. Gantry rotation speed
was 250 msecs and collimation was .6 mm. 0.8 mg of sl NTG was given.
The 3D data set was reconstructed in 5% intervals of the 35-75 % of
the R-R cycle. Phases were analyzed on a dedicated work station
using MPR, MIP and VRT modes. The patient received 80 cc of
contrast.

Coronary Arteries:  Normal coronary origin.  Right dominance.

RCA is a large dominant artery that gives rise to PDA and PLA. There
is no plaque. Distal RCA and PDA not visualized as field of view did
not include the bottom of her heart on CTA (likely due to difference
in breathing betweem calcium score and CTA). No calcified plaque
seen in this region on calcium score.

Left main is a large artery that gives rise to LAD and LCX arteries.

LAD is a large vessel that has no plaque.

LCX is a non-dominant artery that gives rise to one large OM1
branch. There is no plaque.

Other findings:

Left Ventricle: Normal size

Left Atrium: Normal size

Pulmonary Veins: Normal configuration

Right Ventricle: Normal size

Right Atrium: Normal size

Cardiac valves: No calcifications

Thoracic aorta: Normal size

Pulmonary Arteries: Normal size

Systemic Veins: Normal drainage

Pericardium: Normal thickness
IMPRESSION: 1. Coronary calcium score of 0.

2. Normal coronary origin with right dominance.

3. No evidence of CAD. Distal RCA and PDA not visualized on CTA as
field of view did not include the bottom of her heart (likely due to
difference in patient's breathing between calcium score and CTA). No
calcified plaque seen in these arteries on calcium score, but cannot
exclude noncalcified plaque in distal RCA/PDA

CAD-RADS 0 (in visualized arteries). No evidence of CAD (0%).
Consider non-atherosclerotic causes of chest pain.

EXAM:
OVER-READ INTERPRETATION  CT CHEST

The following report is an over-read performed by radiologist Dr.
over-read does not include interpretation of cardiac or coronary
anatomy or pathology. The CTA interpretation by the cardiologist is
attached.
FINDINGS: Limited view of the lung parenchyma demonstrates no suspicious
nodularity. Airways are normal.

Limited view of the mediastinum demonstrates no adenopathy.
Esophagus normal.

Limited view of the upper abdomen unremarkable.

Limited view of the skeleton and chest wall is unremarkable.
IMPRESSION: No significant extracardiac findings.

*** End of Addendum ***
FINDINGS: A 100 kV prospective scan was triggered in the descending thoracic
aorta at 111 HU's. Axial non-contrast 3 mm slices were carried out
through the heart. The data set was analyzed on a dedicated work
station and scored using the Agatson method. Gantry rotation speed
was 250 msecs and collimation was .6 mm. 0.8 mg of sl NTG was given.
The 3D data set was reconstructed in 5% intervals of the 35-75 % of
the R-R cycle. Phases were analyzed on a dedicated work station
using MPR, MIP and VRT modes. The patient received 80 cc of
contrast.

Coronary Arteries:  Normal coronary origin.  Right dominance.

RCA is a large dominant artery that gives rise to PDA and PLA. There
is no plaque. Distal RCA and PDA not visualized as field of view did
not include the bottom of her heart on CTA (likely due to difference
in breathing betweem calcium score and CTA). No calcified plaque
seen in this region on calcium score.

Left main is a large artery that gives rise to LAD and LCX arteries.

LAD is a large vessel that has no plaque.

LCX is a non-dominant artery that gives rise to one large OM1
branch. There is no plaque.

Other findings:

Left Ventricle: Normal size

Left Atrium: Normal size

Pulmonary Veins: Normal configuration

Right Ventricle: Normal size

Right Atrium: Normal size

Cardiac valves: No calcifications

Thoracic aorta: Normal size

Pulmonary Arteries: Normal size

Systemic Veins: Normal drainage

Pericardium: Normal thickness
IMPRESSION: 1. Coronary calcium score of 0.

2. Normal coronary origin with right dominance.

3. No evidence of CAD. Distal RCA and PDA not visualized on CTA as
field of view did not include the bottom of her heart (likely due to
difference in patient's breathing between calcium score and CTA). No
calcified plaque seen in these arteries on calcium score, but cannot
exclude noncalcified plaque in distal RCA/PDA

CAD-RADS 0 (in visualized arteries). No evidence of CAD (0%).
Consider non-atherosclerotic causes of chest pain.

## 2023-12-04 ENCOUNTER — Other Ambulatory Visit: Payer: Self-pay

## 2023-12-04 ENCOUNTER — Emergency Department (HOSPITAL_COMMUNITY)

## 2023-12-04 ENCOUNTER — Inpatient Hospital Stay (HOSPITAL_COMMUNITY)

## 2023-12-04 ENCOUNTER — Encounter (HOSPITAL_COMMUNITY): Payer: Self-pay

## 2023-12-04 ENCOUNTER — Inpatient Hospital Stay (HOSPITAL_COMMUNITY)
Admission: EM | Admit: 2023-12-04 | Discharge: 2023-12-11 | DRG: 065 | Disposition: A | Discharge status: Home / Self Care | Attending: Family Medicine | Admitting: Family Medicine

## 2023-12-04 DIAGNOSIS — Z7984 Long term (current) use of oral hypoglycemic drugs: Secondary | ICD-10-CM | POA: Diagnosis not present

## 2023-12-04 DIAGNOSIS — E440 Moderate protein-calorie malnutrition: Secondary | ICD-10-CM | POA: Diagnosis present

## 2023-12-04 DIAGNOSIS — E876 Hypokalemia: Secondary | ICD-10-CM | POA: Diagnosis present

## 2023-12-04 DIAGNOSIS — K5900 Constipation, unspecified: Secondary | ICD-10-CM | POA: Diagnosis not present

## 2023-12-04 DIAGNOSIS — Z8249 Family history of ischemic heart disease and other diseases of the circulatory system: Secondary | ICD-10-CM

## 2023-12-04 DIAGNOSIS — Z7982 Long term (current) use of aspirin: Secondary | ICD-10-CM

## 2023-12-04 DIAGNOSIS — T380X5A Adverse effect of glucocorticoids and synthetic analogues, initial encounter: Secondary | ICD-10-CM | POA: Diagnosis present

## 2023-12-04 DIAGNOSIS — I10 Essential (primary) hypertension: Secondary | ICD-10-CM | POA: Diagnosis present

## 2023-12-04 DIAGNOSIS — I609 Nontraumatic subarachnoid hemorrhage, unspecified: Principal | ICD-10-CM | POA: Diagnosis present

## 2023-12-04 DIAGNOSIS — R1013 Epigastric pain: Secondary | ICD-10-CM | POA: Diagnosis not present

## 2023-12-04 DIAGNOSIS — Z79899 Other long term (current) drug therapy: Secondary | ICD-10-CM

## 2023-12-04 DIAGNOSIS — L89152 Pressure ulcer of sacral region, stage 2: Secondary | ICD-10-CM | POA: Diagnosis present

## 2023-12-04 DIAGNOSIS — E1165 Type 2 diabetes mellitus with hyperglycemia: Secondary | ICD-10-CM | POA: Diagnosis present

## 2023-12-04 DIAGNOSIS — Z603 Acculturation difficulty: Secondary | ICD-10-CM | POA: Diagnosis present

## 2023-12-04 DIAGNOSIS — Z794 Long term (current) use of insulin: Secondary | ICD-10-CM

## 2023-12-04 DIAGNOSIS — Z681 Body mass index (BMI) 19 or less, adult: Secondary | ICD-10-CM | POA: Diagnosis not present

## 2023-12-04 DIAGNOSIS — E785 Hyperlipidemia, unspecified: Secondary | ICD-10-CM | POA: Diagnosis present

## 2023-12-04 DIAGNOSIS — R112 Nausea with vomiting, unspecified: Secondary | ICD-10-CM | POA: Diagnosis present

## 2023-12-04 DIAGNOSIS — N390 Urinary tract infection, site not specified: Secondary | ICD-10-CM | POA: Diagnosis present

## 2023-12-04 DIAGNOSIS — G919 Hydrocephalus, unspecified: Secondary | ICD-10-CM | POA: Diagnosis present

## 2023-12-04 DIAGNOSIS — B9620 Unspecified Escherichia coli [E. coli] as the cause of diseases classified elsewhere: Secondary | ICD-10-CM | POA: Diagnosis not present

## 2023-12-04 DIAGNOSIS — R110 Nausea: Secondary | ICD-10-CM | POA: Diagnosis not present

## 2023-12-04 DIAGNOSIS — L8990 Pressure ulcer of unspecified site, unspecified stage: Secondary | ICD-10-CM | POA: Insufficient documentation

## 2023-12-04 DIAGNOSIS — R079 Chest pain, unspecified: Secondary | ICD-10-CM | POA: Diagnosis not present

## 2023-12-04 LAB — COMPREHENSIVE METABOLIC PANEL WITH GFR
ALT: 22 U/L (ref 0–44)
AST: 26 U/L (ref 15–41)
Albumin: 4.2 g/dL (ref 3.5–5.0)
Alkaline Phosphatase: 125 U/L (ref 38–126)
Anion gap: 9 (ref 5–15)
BUN: 17 mg/dL (ref 6–20)
CO2: 28 mmol/L (ref 22–32)
Calcium: 9.4 mg/dL (ref 8.9–10.3)
Chloride: 101 mmol/L (ref 98–111)
Creatinine, Ser: 0.69 mg/dL (ref 0.44–1.00)
GFR, Estimated: >60 mL/min (ref >60)
Glucose, Bld: 395 mg/dL — ABNORMAL HIGH (ref 70–99)
Potassium: 3.7 mmol/L (ref 3.5–5.1)
Sodium: 137 mmol/L (ref 135–145)
Total Bilirubin: 0.4 mg/dL (ref 0.0–1.2)
Total Protein: 8 g/dL (ref 6.5–8.1)

## 2023-12-04 LAB — CBC
HCT: 38.4 % (ref 36.0–46.0)
Hemoglobin: 12.8 g/dL (ref 12.0–15.0)
MCH: 27.7 pg (ref 26.0–34.0)
MCHC: 33.3 g/dL (ref 30.0–36.0)
MCV: 83.1 fL (ref 80.0–100.0)
Platelets: 189 K/uL (ref 150–400)
RBC: 4.62 MIL/uL (ref 3.87–5.11)
RDW: 12.3 % (ref 11.5–15.5)
WBC: 6.7 K/uL (ref 4.0–10.5)
nRBC: 0 % (ref 0.0–0.2)

## 2023-12-04 LAB — TROPONIN T, HIGH SENSITIVITY: Troponin T High Sensitivity: <15 ng/L (ref 0–19)

## 2023-12-04 LAB — RESP PANEL BY RT-PCR (RSV, FLU A&B, COVID)  RVPGX2
Influenza A by PCR: NEGATIVE
Influenza B by PCR: NEGATIVE
Resp Syncytial Virus by PCR: NEGATIVE
SARS Coronavirus 2 by RT PCR: NEGATIVE

## 2023-12-04 LAB — LIPASE, BLOOD: Lipase: 43 U/L (ref 11–51)

## 2023-12-04 LAB — CBG MONITORING, ED: Glucose-Capillary: 369 mg/dL — ABNORMAL HIGH (ref 70–99)

## 2023-12-04 MED ORDER — IOHEXOL 350 MG/ML SOLN
75.0000 mL | Freq: Once | INTRAVENOUS | Status: AC | PRN
  Administered 2023-12-04: 75 mL via INTRAVENOUS

## 2023-12-04 MED ORDER — LACTATED RINGERS IV BOLUS
1000.0000 mL | Freq: Once | INTRAVENOUS | Status: AC
  Administered 2023-12-04: 1000 mL via INTRAVENOUS

## 2023-12-04 MED ORDER — LORAZEPAM 2 MG/ML IJ SOLN: 1.0000 mg | Freq: Once | INTRAMUSCULAR | Status: DC

## 2023-12-04 MED ORDER — DIPHENHYDRAMINE HCL 50 MG/ML IJ SOLN
25.0000 mg | Freq: Once | INTRAMUSCULAR | Status: AC
  Administered 2023-12-04: 25 mg via INTRAVENOUS
  Filled 2023-12-04: qty 1

## 2023-12-04 MED ORDER — MORPHINE SULFATE (PF) 2 MG/ML IV SOLN
2.0000 mg | Freq: Once | INTRAVENOUS | Status: AC
  Administered 2023-12-04: 2 mg via INTRAVENOUS
  Filled 2023-12-04: qty 1

## 2023-12-04 MED ORDER — ESMOLOL HCL-SODIUM CHLORIDE 2000 MG/100ML IV SOLN
25.0000 ug/kg/min | INTRAVENOUS | Status: DC
  Filled 2023-12-04: qty 100

## 2023-12-04 MED ORDER — IOHEXOL 350 MG/ML SOLN
100.0000 mL | Freq: Once | INTRAVENOUS | Status: AC | PRN
  Administered 2023-12-04: 100 mL via INTRAVENOUS

## 2023-12-04 MED ORDER — PROCHLORPERAZINE EDISYLATE 10 MG/2ML IJ SOLN
10.0000 mg | Freq: Once | INTRAMUSCULAR | Status: AC
  Administered 2023-12-04: 10 mg via INTRAVENOUS
  Filled 2023-12-04: qty 2

## 2023-12-04 MED ORDER — CLEVIDIPINE BUTYRATE 0.5 MG/ML IV EMUL
0.0000 mg/h | INTRAVENOUS | Status: DC
  Administered 2023-12-04: 2 mg/h via INTRAVENOUS
  Filled 2023-12-04: qty 50

## 2023-12-04 MED ORDER — HYDRALAZINE HCL 20 MG/ML IJ SOLN: 10.0000 mg | Freq: Once | INTRAMUSCULAR | Status: DC

## 2023-12-04 NOTE — ED Triage Notes (Signed)
 Patient has been vomiting since this morning. Pain is epigastric that moves into her chest and upper back. No diarrhea. Vomiting during triage.

## 2023-12-04 NOTE — H&P (Signed)
 NAME:  Kimberly Fry, MRN:  [id number], DOB:  1965/03/22, LOS: 0 ADMISSION DATE:  12/04/2023, CONSULTATION DATE:  12/04/23 REFERRING MD: Prentice Medicus MD CHIEF COMPLAINT:  Chest Pain/Abdominal pain   History of Present Illness:  Pt is a 27 yr spanish speaking female with significant pmhx of diabetes, HTN, and HLD presenting to [facility] ED for complaints of chest pain/abdominal pain which began earlier this morning, she had been her usual state of health the day before. Patient reports that she has had an ongoing headache with intermittent vomiting. Upon initial work up in the ED, CT of head revealed a subarachnoid hemorrhage centered in the right sylvian fissure and basilar cisterns, greatest on the right but no intraparenchymal hemorrhage, hydrocephalus, or acute infarct. CT Angio Chest/abdomen/pelvis obtained to rule out aortic dissection which was negative for but did reveal large amount of stool burden in colon.   Upon arrival to [facility] Neuro ICU, patient off cleviprex. SBP 110s-130s. Utilized translator to assist with obtaining history. Patient stated that she hit her head on a piece of furniture about 8 days ago. Patient stated she did not fall but has issues with her vision due to her diabetes. She endorses that she can only see shadows nothing more. Per patient, her eye sight has been worsening over the last 1 yr. She is supposed to be on insulin  70/30 at home, along with glimepiride  but patient states she was only given these medications in the hospital. Patient mentions she lives with daughter at home and that is who brought her to the hospital. Prior to yesterday 11/1, patient was in her usual state of health. Patient confirms that headache and neck pain were a 10/10 but now a 6. Pain has improved.   Pertinent  Medical History   Past Medical History:  Diagnosis Date   Diabetes mellitus without complication (HCC)   HTN HLD   Significant Hospital Events: Including procedures,  antibiotic start and stop dates in addition to other pertinent events   12/04/23 PCCM Admit, patient with SAH  Interim History / Subjective:  Patient transferred from [facility] ED to 4N ICU  Off cleviprex   Alert, follows commands, oriented x 3 to 4, disoriented to actual location   Objective    Blood pressure (!) 157/75, pulse 69, temperature 98.2 F (36.8 C), temperature source Oral, resp. rate 18, height 5' 4 (1.626 m), weight 40 kg, SpO2 99%.       No intake or output data in the 24 hours ending 12/04/23 2149 Filed Weights   12/04/23 1640  Weight: 40 kg    Examination: General: pleasant acute on chronic, adult spanish speaking female, in NAD HENT: Normocephalic, PERRLA intact, poor dentition-ketonic breath, missing teeth, pink mm  Lungs: clear, on RA, no distress Cardiovascular: s1,s2, RRR, no JVD, no mrg  Abdomen: bs active Extremities: moves all extremities to command, no deformity  Neuro: alert, oriented x3, unable to provide actual location  GU: deferred   Resolved problem list   Assessment and Plan  SAH  CT of head revealed a subarachnoid hemorrhage centered in the right sylvian fissure and basilar cisterns, greatest on the right but no intraparenchymal hemorrhage, hydrocephalus, or acute infarct.  P: Admit to Neuro ICU for frequent neuro checks  SBP goal less than 160, utilize cleviprex 130-150 goal  Maintain neuro protective measures; goal for eurothermia, euglycemia, eunatermia, normoxia, and PCO2 goal of 35-40 Seizure precautions  Aspirations precautions  Empiric Keppra per NSGY Stat imaging for any  acute neurochanges Avoid narcotics as able  As needed Zofran  for nausea NPO for now  Notify NSGY of patients arrival to [facility] ED   HTN P: Utilize cleviprex for SBP goal 130-150  HLD  P:  Restart Statin tomorrow on 11/2  Uncontrolled DM Hyperglycemia- CBG > 300  Suspect patient has diabetic retinopathy from uncontrolled DM  No evidence of DKA/HHNK   Last hgb A1c >15 in 6/22  On insulin  70/30-14 units BID daily with meal, and glimepiride  once daily  P: CBG goal 140-180 Place on Q4 CBG, moderate scale insulin - if not able to control  Will need insulin  gtt in meantime  Suspect will have elevated hgb A1c, obtain hgb A1c  Will need diabetes coordinator to help manage/give recs  Obtain urinalysis   Medication Assistance  Patient not taking any medications at home -supposed to be on insulin  70/30  Glimepiride , aspirin , lipitor  P: CSM consult  Diabetes coordinator consult    Labs   CBC: Recent Labs  Lab 12/04/23 1654  WBC 6.7  HGB 12.8  HCT 38.4  MCV 83.1  PLT 189    Basic Metabolic Panel: Recent Labs  Lab 12/04/23 1654  NA 137  K 3.7  CL 101  CO2 28  GLUCOSE 395*  BUN 17  CREATININE 0.69  CALCIUM  9.4   GFR: Estimated Creatinine Clearance: 48.4 mL/min (by C-G formula based on SCr of 0.69 mg/dL). Recent Labs  Lab 12/04/23 1654  WBC 6.7    Liver Function Tests: Recent Labs  Lab 12/04/23 1654  AST 26  ALT 22  ALKPHOS 125  BILITOT 0.4  PROT 8.0  ALBUMIN 4.2   Recent Labs  Lab 12/04/23 1654  LIPASE 43   No results for input(s): AMMONIA in the last 168 hours.  ABG    Component Value Date/Time   HCO3 26.6 07/24/2020 1321   TCO2 28 07/23/2020 2338   O2SAT 71.0 07/24/2020 1321     Coagulation Profile: No results for input(s): INR, PROTIME in the last 168 hours.  Cardiac Enzymes: No results for input(s): CKTOTAL, CKMB, CKMBINDEX, TROPONINI in the last 168 hours.  HbA1C: Hgb A1c MFr Bld  Date/Time Value Ref Range Status  07/24/2020 03:30 AM >15.5 (H) 4.8 - 5.6 % Final    Comment:    (NOTE) **Verified by repeat analysis**         Prediabetes: 5.7 - 6.4         Diabetes: >6.4         Glycemic control for adults with diabetes: <7.0     CBG: Recent Labs  Lab 12/04/23 1641  GLUCAP 369*    Review of Systems:   Review of Systems  Constitutional: Negative.   HENT:  Negative.    Eyes:  Negative for blurred vision.  Respiratory: Negative.    Cardiovascular: Negative.   Gastrointestinal: Negative.   Genitourinary: Negative.   Musculoskeletal: Negative.  Negative for neck pain.  Skin: Negative.   Neurological:  Positive for headaches.  Endo/Heme/Allergies: Negative.   Psychiatric/Behavioral: Negative.       Past Medical History:  She,  has a past medical history of Diabetes mellitus without complication (HCC).   Surgical History:   Past Surgical History:  Procedure Laterality Date   KNEE SURGERY       Social History:   reports that she has never smoked. She has never used smokeless tobacco. She reports that she does not drink alcohol and does not use drugs.   Family History:  Her family history includes Heart disease in her father.   Allergies No Known Allergies   Home Medications  Prior to Admission medications   Medication Sig Start Date End Date Taking? Authorizing Provider  Accu-Chek Softclix Lancets lancets Use as directed. 07/26/20   Samtani, Jai-Gurmukh, MD  amoxicillin -clavulanate (AUGMENTIN ) 875-125 MG tablet Take 1 tablet by mouth every 12 (twelve) hours. 06/19/22   Small, Brooke L, PA  aspirin  325 MG tablet Take 1 tablet (325 mg total) by mouth daily. 07/27/20   Samtani, Jai-Gurmukh, MD  atorvastatin  (LIPITOR) 40 MG tablet Take 1 tablet (40 mg total) by mouth daily. 07/27/20   Samtani, Jai-Gurmukh, MD  benzonatate  (TESSALON ) 100 MG capsule Take 1 capsule (100 mg total) by mouth every 8 (eight) hours. 06/19/22   Small, Brooke L, PA  Blood Glucose Monitoring Suppl (ACCU-CHEK GUIDE) w/Device KIT 1 each by Does not apply route 4 (four) times daily. 07/26/20   Samtani, Jai-Gurmukh, MD  doxycycline  (VIBRAMYCIN ) 100 MG capsule Take 1 capsule (100 mg total) by mouth 2 (two) times daily. 06/19/22   Small, Brooke L, PA  glimepiride  (AMARYL ) 2 MG tablet Take 0.5 tablets (1 mg total) by mouth daily with breakfast. 07/27/20   Samtani, Jai-Gurmukh,  MD  glucose blood (ACCU-CHEK GUIDE) test strip Use as instructed 07/26/20   Samtani, Jai-Gurmukh, MD  guaiFENesin  (MUCINEX ) 600 MG 12 hr tablet Take 1 tablet (600 mg total) by mouth 2 (two) times daily. 06/19/22   Small, Brooke L, PA  insulin  aspart protamine- aspart (NOVOLOG  MIX 70/30) (70-30) 100 UNIT/ML injection Inject 0.14 mLs (14 Units total) into the skin 2 (two) times daily with a meal. 07/26/20   Samtani, Jai-Gurmukh, MD  Insulin  Syringe-Needle U-100 30G X 1/2 0.3 ML MISC Use as directed with insulin  07/26/20   Samtani, Jai-Gurmukh, MD  nadolol  (CORGARD ) 20 MG tablet Take 1 tablet (20 mg total) by mouth daily. 07/27/20   Samtani, Jai-Gurmukh, MD     Critical care time: 55 mins      [facility]   [employer] [facility] 12/05/2023, 6:28 AM  Please see [email or web address] for pager details.  From 7A-7P if no response, please call [phone]. After hours, please call ELink [phone].

## 2023-12-04 NOTE — ED Provider Notes (Signed)
 [place] EMERGENCY DEPARTMENT AT [facility] Provider Note   CSN: [id number] Arrival date & time: 12/04/23  1630     Patient presents with: Emesis   Kimberly Fry is a 58 y.o. female.   58 year old female with past medical history of diabetes, hypertension, and hyperlipidemia presenting to the emergency department today with chest and abdominal pain.  This apparently started this morning abruptly.  The patient has had multiple episodes of nonbloody, nonbilious emesis with this.  Denies any diarrhea.  The patient was in her normal state of health until yesterday.  Patient also reports she has been having a headache with this.  Denies any fevers.  She came to the emergency department today for further evaluation due to ongoing symptoms.  She has been afebrile.  History taken using interpreter services   Emesis Associated symptoms: abdominal pain and headaches        Prior to Admission medications   Medication Sig Start Date End Date Taking? Authorizing Provider  Accu-Chek Softclix Lancets lancets Use as directed. 07/26/20  Yes Samtani, Jai-Gurmukh, MD  Blood Glucose Monitoring Suppl (ACCU-CHEK GUIDE) w/Device KIT 1 each by Does not apply route 4 (four) times daily. 07/26/20  Yes Samtani, Jai-Gurmukh, MD  glucose blood (ACCU-CHEK GUIDE) test strip Use as instructed 07/26/20  Yes Samtani, Jai-Gurmukh, MD  ibuprofen (ADVIL) 200 MG tablet Take 200 mg by mouth as needed for headache, fever or mild pain (pain score 1-3).   Yes [provider]  Insulin  Syringe-Needle U-100 30G X 1/2 0.3 ML MISC Use as directed with insulin  07/26/20  Yes Samtani, Jai-Gurmukh, MD  NOVOLIN N RELION 100 UNIT/ML injection Inject 25-30 Units into the skin daily. 09/28/23  Yes [provider]  amoxicillin -clavulanate (AUGMENTIN ) 875-125 MG tablet Take 1 tablet by mouth every 12 (twelve) hours. Patient not taking: Reported on 12/04/2023 06/19/22   Small, Brooke L, PA  aspirin  325 MG  tablet Take 1 tablet (325 mg total) by mouth daily. Patient not taking: Reported on 12/04/2023 07/27/20   Samtani, Jai-Gurmukh, MD  atorvastatin  (LIPITOR) 40 MG tablet Take 1 tablet (40 mg total) by mouth daily. Patient not taking: Reported on 12/04/2023 07/27/20   Samtani, Jai-Gurmukh, MD  benzonatate  (TESSALON ) 100 MG capsule Take 1 capsule (100 mg total) by mouth every 8 (eight) hours. Patient not taking: Reported on 12/04/2023 06/19/22   Small, Brooke L, PA  doxycycline  (VIBRAMYCIN ) 100 MG capsule Take 1 capsule (100 mg total) by mouth 2 (two) times daily. Patient not taking: Reported on 12/04/2023 06/19/22   Small, Brooke L, PA  glimepiride  (AMARYL ) 2 MG tablet Take 0.5 tablets (1 mg total) by mouth daily with breakfast. Patient not taking: Reported on 12/04/2023 07/27/20   Samtani, Jai-Gurmukh, MD  guaiFENesin  (MUCINEX ) 600 MG 12 hr tablet Take 1 tablet (600 mg total) by mouth 2 (two) times daily. Patient not taking: Reported on 12/04/2023 06/19/22   Small, Brooke L, PA  insulin  aspart protamine- aspart (NOVOLOG  MIX 70/30) (70-30) 100 UNIT/ML injection Inject 0.14 mLs (14 Units total) into the skin 2 (two) times daily with a meal. Patient not taking: Reported on 12/04/2023 07/26/20   Samtani, Jai-Gurmukh, MD  nadolol  (CORGARD ) 20 MG tablet Take 1 tablet (20 mg total) by mouth daily. Patient not taking: Reported on 12/04/2023 07/27/20   Samtani, Jai-Gurmukh, MD    Allergies: Patient has no known allergies.    Review of Systems  Cardiovascular:  Positive for chest pain.  Gastrointestinal:  Positive for abdominal pain  and vomiting.  Neurological:  Positive for headaches.  All other systems reviewed and are negative.   Updated Vital Signs BP 135/74   Pulse 75   Temp 98.2 F (36.8 C) (Oral)   Resp (!) 23   Ht 5' 4 (1.626 m)   Wt 49.1 kg   SpO2 97%   BMI 18.58 kg/m   Physical Exam Vitals and nursing note reviewed.   Gen: Appears uncomfortable, moaning during entire interview and not really  contributing much to history with most history being given from family members Eyes: PERRL, EOMI HEENT: no oropharyngeal swelling Neck: trachea midline Resp: clear to auscultation bilaterally Card: RRR, no murmurs, rubs, or gallops Abd: Diffusely tender with no guarding rebound Extremities: no calf tenderness, no edema Vascular: 2+ radial pulses bilaterally, 2+ DP pulses bilaterally Neuro: Cranial nerves intact, equal strength and sensation throughout bilateral upper and lower extremities Skin: no rashes Psyc: acting appropriately   (all labs ordered are listed, but only abnormal results are displayed) Labs Reviewed  COMPREHENSIVE METABOLIC PANEL WITH GFR - Abnormal; Notable for the following components:      Result Value   Glucose, Bld 395 (*)    All other components within normal limits  CBG MONITORING, ED - Abnormal; Notable for the following components:   Glucose-Capillary 369 (*)    All other components within normal limits  RESP PANEL BY RT-PCR (RSV, FLU A&B, COVID)  RVPGX2  CBC  LIPASE, BLOOD  URINALYSIS, ROUTINE W REFLEX MICROSCOPIC  TROPONIN T, HIGH SENSITIVITY  TROPONIN T, HIGH SENSITIVITY    EKG: EKG Interpretation Date/Time:  Saturday December 04 2023 17:07:07 EDT Ventricular Rate:  70 PR Interval:  150 QRS Duration:  84 QT Interval:  410 QTC Calculation: 443 R Axis:   50  Text Interpretation: Sinus rhythm Low voltage, precordial leads Confirmed by Ula Barter [phone]) on 12/04/2023 5:26:41 PM  Radiology: CT Angio Chest/Abd/Pel for Dissection W and/or Wo Contrast Result Date: 12/04/2023 EXAM: CTA CHEST, ABDOMEN AND PELVIS WITHOUT AND WITH CONTRAST 12/04/2023 09:05:13 PM TECHNIQUE: CTA of the chest was performed without and with the administration of 100 mL of iohexol  (OMNIPAQUE ) 350 MG/ML intravenous contrast. CTA of the abdomen and pelvis was performed without and with the administration of 100 mL of iohexol  (OMNIPAQUE ) 350 MG/ML intravenous contrast.  Multiplanar reformatted images are provided for review. MIP images are provided for review. Automated exposure control, iterative reconstruction, and/or weight based adjustment of the mA/kV was utilized to reduce the radiation dose to as low as reasonably achievable. COMPARISON: 07/25/2020 CLINICAL HISTORY: Acute aortic syndrome (AAS) suspected; CP/Abd pain, Hypertensive. FINDINGS: VASCULATURE: AORTA: No acute finding. No abdominal aortic aneurysm. No dissection. PULMONARY ARTERIES: No pulmonary embolism with the limits of this exam. GREAT VESSELS OF AORTIC ARCH: No acute finding. No dissection. No arterial occlusion or significant stenosis. CELIAC TRUNK: No acute finding. No occlusion or significant stenosis. SUPERIOR MESENTERIC ARTERY: No acute finding. No occlusion or significant stenosis. INFERIOR MESENTERIC ARTERY: No acute finding. No occlusion or significant stenosis. RENAL ARTERIES: No acute finding. No occlusion or significant stenosis. ILIAC ARTERIES: No acute finding. No occlusion or significant stenosis. CHEST: MEDIASTINUM: No mediastinal lymphadenopathy. The heart and pericardium demonstrate no acute abnormality. LUNGS AND PLEURA: The lungs are without acute process. No focal consolidation or pulmonary edema. No evidence of pleural effusion or pneumothorax. THORACIC BONES AND SOFT TISSUES: No acute bone or soft tissue abnormality. ABDOMEN AND PELVIS: LIVER: The liver is unremarkable. GALLBLADDER AND BILE DUCTS: Gallbladder is unremarkable. No biliary  ductal dilatation. SPLEEN: The spleen is unremarkable. PANCREAS: The pancreas is unremarkable. ADRENAL GLANDS: Bilateral adrenal glands demonstrate no acute abnormality. KIDNEYS, URETERS AND BLADDER: No stones in the kidneys or ureters. No hydronephrosis. No perinephric or periureteral stranding. Urinary bladder is unremarkable. GI AND BOWEL: Stomach and duodenal sweep demonstrate no acute abnormality. There is no bowel obstruction. No abnormal bowel wall  thickening or distension. Large stool burden throughout the colon. REPRODUCTIVE: Reproductive organs are unremarkable. PERITONEUM AND RETROPERITONEUM: No ascites or free air. LYMPH NODES: No lymphadenopathy. ABDOMINAL BONES AND SOFT TISSUES: No acute abnormality of the bones. No acute soft tissue abnormality. IMPRESSION: 1. No evidence of acute aortic syndrome. 2. Large stool burden throughout the colon. Electronically signed by: Franky Crease MD 12/04/2023 09:18 PM EDT RP Workstation: [id number]   CT Head Wo Contrast Result Date: 12/04/2023 EXAM: CT HEAD WITHOUT CONTRAST 12/04/2023 09:05:13 PM TECHNIQUE: CT of the head was performed without the administration of intravenous contrast. Automated exposure control, iterative reconstruction, and/or weight based adjustment of the mA/kV was utilized to reduce the radiation dose to as low as reasonably achievable. COMPARISON: MRI 07/25/2020 CLINICAL HISTORY: Headache, increasing frequency or severity. FINDINGS: BRAIN AND VENTRICLES: Subarachnoid blood noted in the right sylvian fissure and basilar cisterns most pronounced on the right. No intraparenchymal hemorrhage. No acute infarct. No hydrocephalus. No mass effect or midline shift. ORBITS: No acute abnormality. SINUSES: No acute abnormality. SOFT TISSUES AND SKULL: No acute soft tissue abnormality. No skull fracture. IMPRESSION: 1. Subarachnoid hemorrhage centered in the right sylvian fissure and basilar cisterns, greatest on the right. 2. No intraparenchymal hemorrhage, hydrocephalus, or acute infarct. 3. Results were called to Dr. Ula at the time of interpretation. Electronically signed by: Franky Crease MD 12/04/2023 09:14 PM EDT RP Workstation: [id number]     Procedures   Medications Ordered in the ED  clevidipine (CLEVIPREX) infusion 0.5 mg/mL (2 mg/hr Intravenous Rate/Dose Change 12/04/23 2215)  lactated ringers bolus 1,000 mL (0 mLs Intravenous Stopped 12/04/23 1847)  prochlorperazine (COMPAZINE) injection  10 mg (10 mg Intravenous Given 12/04/23 1717)  diphenhydrAMINE (BENADRYL) injection 25 mg (25 mg Intravenous Given 12/04/23 1718)  morphine (PF) 2 MG/ML injection 2 mg (2 mg Intravenous Given 12/04/23 1718)  iohexol  (OMNIPAQUE ) 350 MG/ML injection 100 mL (100 mLs Intravenous Contrast Given 12/04/23 2105)  iohexol  (OMNIPAQUE ) 350 MG/ML injection 75 mL (75 mLs Intravenous Contrast Given 12/04/23 2317)                                    Medical Decision Making 58 year old female with past medical history of diabetes, hypertension, and hyperlipidemia presenting to the emergency department today with chest and abdominal pain.  I will further evaluate the patient here with basic labs including LFTs and lipase to evaluate for hepatobiliary pathology or pancreatitis.  Given the patient's significantly elevated blood pressure on arrival here with a complaints of chest and abdominal pain will further evaluate her here with a CT angiogram to evaluate for aortic dissection.  Also obtain CT scan of her head given her headache.  She does have a reassuring neuroexam at this time.  I will give the patient Compazine and Benadryl which should help with the nausea and also headache.  Give her low-dose morphine here as well for the pain.  I will reevaluate for ultimate disposition.  Will recheck her blood pressure after pain and nausea medications to determine the need for IV antihypertensives.  Her EKG interpreted by me does show some T wave flattening but no obvious findings consistent with ischemia here.  The patient's workup here was revealing for subarachnoid hemorrhage on CT.  CT angiogram was negative for aortic pathology.  The patient's blood pressure did remain mildly elevated.  The patient is covered with Cleviprex.  I discussed that with Dr. Gillie.  Recommends admission to [facility].  I did discuss this with Dr. Maree who accepts patient for admission to [facility].  CRITICAL CARE Performed by: Prentice JONELLE Medicus   Total critical care time: 45 minutes  Critical care time was exclusive of separately billable procedures and treating other patients.  Critical care was necessary to treat or prevent imminent or life-threatening deterioration.  Critical care was time spent personally by me on the following activities: development of treatment plan with patient and/or surrogate as well as nursing, discussions with consultants, evaluation of patient's response to treatment, examination of patient, obtaining history from patient or surrogate, ordering and performing treatments and interventions, ordering and review of laboratory studies, ordering and review of radiographic studies, pulse oximetry and re-evaluation of patient's condition.   Amount and/or Complexity of Data Reviewed Labs: ordered. Radiology: ordered.  Risk Prescription drug management. Decision regarding hospitalization.        Final diagnoses:  Subarachnoid hemorrhage [facility])    ED Discharge Orders     None          Medicus Prentice JONELLE, MD 12/04/23 2349

## 2023-12-05 ENCOUNTER — Inpatient Hospital Stay (HOSPITAL_COMMUNITY)

## 2023-12-05 DIAGNOSIS — I609 Nontraumatic subarachnoid hemorrhage, unspecified: Secondary | ICD-10-CM | POA: Diagnosis not present

## 2023-12-05 DIAGNOSIS — E1165 Type 2 diabetes mellitus with hyperglycemia: Secondary | ICD-10-CM

## 2023-12-05 DIAGNOSIS — I10 Essential (primary) hypertension: Secondary | ICD-10-CM

## 2023-12-05 DIAGNOSIS — R079 Chest pain, unspecified: Secondary | ICD-10-CM | POA: Diagnosis not present

## 2023-12-05 LAB — RAPID URINE DRUG SCREEN, HOSP PERFORMED
Amphetamines: NOT DETECTED
Barbiturates: NOT DETECTED
Benzodiazepines: NOT DETECTED
Cocaine: NOT DETECTED
Opiates: NOT DETECTED
Tetrahydrocannabinol: NOT DETECTED

## 2023-12-05 LAB — ECHOCARDIOGRAM COMPLETE
AR max vel: 1.57 cm2
AV Peak grad: 9.2 mmHg
Ao pk vel: 1.52 m/s
Area-P 1/2: 3.37 cm2
Height: 64 in
S' Lateral: 2 cm
Weight: 1731.93 [oz_av]

## 2023-12-05 LAB — GLUCOSE, CAPILLARY
Glucose-Capillary: 145 mg/dL — ABNORMAL HIGH (ref 70–99)
Glucose-Capillary: 183 mg/dL — ABNORMAL HIGH (ref 70–99)
Glucose-Capillary: 194 mg/dL — ABNORMAL HIGH (ref 70–99)
Glucose-Capillary: 219 mg/dL — ABNORMAL HIGH (ref 70–99)
Glucose-Capillary: 236 mg/dL — ABNORMAL HIGH (ref 70–99)
Glucose-Capillary: 453 mg/dL — ABNORMAL HIGH (ref 70–99)
Glucose-Capillary: 97 mg/dL (ref 70–99)

## 2023-12-05 LAB — URINALYSIS, ROUTINE W REFLEX MICROSCOPIC
Bilirubin Urine: NEGATIVE
Glucose, UA: 50 mg/dL — AB
Ketones, ur: 5 mg/dL — AB
Nitrite: NEGATIVE
Protein, ur: 30 mg/dL — AB
RBC / HPF: >50 RBC/hpf (ref 0–5)
Specific Gravity, Urine: 1.014 (ref 1.005–1.030)
WBC, UA: >50 WBC/hpf (ref 0–5)
pH: 5 (ref 5.0–8.0)

## 2023-12-05 LAB — PROTIME-INR
INR: 1 (ref 0.8–1.2)
Prothrombin Time: 13.9 s (ref 11.4–15.2)

## 2023-12-05 LAB — CBC
HCT: 39.6 % (ref 36.0–46.0)
Hemoglobin: 12.8 g/dL (ref 12.0–15.0)
MCH: 27.2 pg (ref 26.0–34.0)
MCHC: 32.3 g/dL (ref 30.0–36.0)
MCV: 84.3 fL (ref 80.0–100.0)
Platelets: 192 K/uL (ref 150–400)
RBC: 4.7 MIL/uL (ref 3.87–5.11)
RDW: 12.1 % (ref 11.5–15.5)
WBC: 7.2 K/uL (ref 4.0–10.5)
nRBC: 0 % (ref 0.0–0.2)

## 2023-12-05 LAB — MAGNESIUM: Magnesium: 1.8 mg/dL (ref 1.7–2.4)

## 2023-12-05 LAB — APTT: aPTT: 23 s — ABNORMAL LOW (ref 24–36)

## 2023-12-05 LAB — HEMOGLOBIN A1C
Hgb A1c MFr Bld: 9.5 % — ABNORMAL HIGH (ref 4.8–5.6)
Mean Plasma Glucose: 225.95 mg/dL

## 2023-12-05 LAB — MRSA NEXT GEN BY PCR, NASAL: MRSA by PCR Next Gen: NOT DETECTED

## 2023-12-05 LAB — HIV ANTIBODY (ROUTINE TESTING W REFLEX): HIV Screen 4th Generation wRfx: NONREACTIVE

## 2023-12-05 LAB — PHOSPHORUS: Phosphorus: 2.8 mg/dL (ref 2.5–4.6)

## 2023-12-05 MED ORDER — NIMODIPINE 30 MG PO CAPS
60.0000 mg | ORAL_CAPSULE | ORAL | Status: DC
  Administered 2023-12-05 – 2023-12-10 (×30): 60 mg via ORAL
  Filled 2023-12-05 (×31): qty 2

## 2023-12-05 MED ORDER — CHLORHEXIDINE GLUCONATE CLOTH 2 % EX PADS
6.0000 | MEDICATED_PAD | Freq: Every day | CUTANEOUS | Status: DC
  Administered 2023-12-05 – 2023-12-11 (×7): 6 via TOPICAL

## 2023-12-05 MED ORDER — INSULIN GLARGINE-YFGN 100 UNIT/ML ~~LOC~~ SOLN
10.0000 [IU] | Freq: Every day | SUBCUTANEOUS | Status: DC
  Filled 2023-12-05 (×2): qty 0.1

## 2023-12-05 MED ORDER — ACETAMINOPHEN 160 MG/5ML PO SOLN: 650.0000 mg | ORAL | Status: DC | PRN

## 2023-12-05 MED ORDER — ACETAMINOPHEN 650 MG RE SUPP: 650.0000 mg | RECTAL | Status: DC | PRN

## 2023-12-05 MED ORDER — INSULIN ASPART 100 UNIT/ML IJ SOLN
0.0000 [IU] | INTRAMUSCULAR | Status: DC
  Administered 2023-12-05: 4 [IU] via SUBCUTANEOUS
  Administered 2023-12-05: 3 [IU] via SUBCUTANEOUS
  Administered 2023-12-06: 4 [IU] via SUBCUTANEOUS
  Administered 2023-12-06: 7 [IU] via SUBCUTANEOUS
  Administered 2023-12-06: 4 [IU] via SUBCUTANEOUS
  Administered 2023-12-06: 20 [IU] via SUBCUTANEOUS
  Administered 2023-12-07: 4 [IU] via SUBCUTANEOUS
  Administered 2023-12-07: 7 [IU] via SUBCUTANEOUS
  Administered 2023-12-07: 15 [IU] via SUBCUTANEOUS
  Administered 2023-12-07: 7 [IU] via SUBCUTANEOUS
  Filled 2023-12-05: qty 7
  Filled 2023-12-05: qty 3
  Filled 2023-12-05: qty 15

## 2023-12-05 MED ORDER — INSULIN ASPART 100 UNIT/ML IJ SOLN
0.0000 [IU] | INTRAMUSCULAR | Status: DC
  Administered 2023-12-05: 5 [IU] via SUBCUTANEOUS

## 2023-12-05 MED ORDER — ACETAMINOPHEN 325 MG PO TABS
650.0000 mg | ORAL_TABLET | ORAL | Status: DC | PRN
  Administered 2023-12-05 – 2023-12-08 (×7): 650 mg via ORAL
  Filled 2023-12-05 (×7): qty 2

## 2023-12-05 MED ORDER — PANTOPRAZOLE SODIUM 40 MG IV SOLR
40.0000 mg | Freq: Every day | INTRAVENOUS | Status: DC
  Administered 2023-12-05 – 2023-12-11 (×2): 40 mg via INTRAVENOUS
  Filled 2023-12-05 (×2): qty 10

## 2023-12-05 MED ORDER — ORAL CARE MOUTH RINSE
15.0000 mL | OROMUCOSAL | Status: DC | PRN
Start: 2023-12-05 — End: 2023-12-11

## 2023-12-05 MED ORDER — SODIUM CHLORIDE 0.9 % IV SOLN
2.0000 g | INTRAVENOUS | Status: AC
  Administered 2023-12-05 – 2023-12-07 (×3): 2 g via INTRAVENOUS
  Filled 2023-12-05 (×3): qty 20

## 2023-12-05 MED ORDER — SODIUM CHLORIDE 0.9 % IV SOLN: INTRAVENOUS | Status: AC

## 2023-12-05 MED ORDER — STROKE: EARLY STAGES OF RECOVERY BOOK
Freq: Once | Status: AC
  Filled 2023-12-05: qty 1

## 2023-12-05 MED ORDER — PANTOPRAZOLE SODIUM 40 MG PO TBEC
40.0000 mg | DELAYED_RELEASE_TABLET | Freq: Every day | ORAL | Status: DC
  Administered 2023-12-06 – 2023-12-10 (×5): 40 mg via ORAL
  Filled 2023-12-05 (×7): qty 1

## 2023-12-05 MED ORDER — DOCUSATE SODIUM 100 MG PO CAPS
100.0000 mg | ORAL_CAPSULE | Freq: Two times a day (BID) | ORAL | Status: DC
  Administered 2023-12-06 – 2023-12-07 (×4): 100 mg via ORAL
  Filled 2023-12-05 (×5): qty 1

## 2023-12-05 NOTE — Progress Notes (Addendum)
 NAME:  Dailee Manalang, MRN:  [id number], DOB:  Jul 16, 1965, LOS: 1 ADMISSION DATE:  12/04/2023, CONSULTATION DATE:  12/04/23 REFERRING MD: Prentice Medicus MD CHIEF COMPLAINT:  Chest Pain/Abdominal pain   History of Present Illness:  Pt is a 10 yr spanish speaking female with significant pmhx of diabetes, HTN, and HLD presenting to [facility] ED for complaints of chest pain/abdominal pain which began earlier this morning, she had been her usual state of health the day before. Patient reports that she has had an ongoing headache with intermittent vomiting. Upon initial work up in the ED, CT of head revealed a subarachnoid hemorrhage centered in the right sylvian fissure and basilar cisterns, greatest on the right but no intraparenchymal hemorrhage, hydrocephalus, or acute infarct. CT Angio Chest/abdomen/pelvis obtained to rule out aortic dissection which was negative for but did reveal large amount of stool burden in colon.   Upon arrival to [facility] Neuro ICU, patient off cleviprex. SBP 110s-130s. Utilized translator to assist with obtaining history. Patient stated that she hit her head on a piece of furniture about 8 days ago. Patient stated she did not fall but has issues with her vision due to her diabetes. She endorses that she can only see shadows nothing more. Per patient, her eye sight has been worsening over the last 1 yr. She is supposed to be on insulin  70/30 at home, along with glimepiride  but patient states she was only given these medications in the hospital. Patient mentions she lives with daughter at home and that is who brought her to the hospital. Prior to yesterday 11/1, patient was in her usual state of health. Patient confirms that headache and neck pain were a 10/10 but now a 6. Pain has improved.   Pertinent  Medical History   Past Medical History:  Diagnosis Date   Diabetes mellitus without complication (HCC)   HTN HLD   Significant Hospital Events: Including procedures,  antibiotic start and stop dates in addition to other pertinent events   12/04/23 PCCM Admit, patient with SAH  Interim History / Subjective:   No acute events  Objective    Blood pressure 122/66, pulse 69, temperature 99.1 F (37.3 C), temperature source Oral, resp. rate 15, height 5' 4 (1.626 m), weight 49.1 kg, SpO2 91%.        Intake/Output Summary (Last 24 hours) at 12/05/2023 0950 Last data filed at 12/05/2023 0700 Gross per 24 hour  Intake 65.62 ml  Output --  Net 65.62 ml   Filed Weights   12/04/23 1640 12/04/23 2152  Weight: 40 kg 49.1 kg    Examination: Awake, oriented.  No focal deficits Pupils reactive Lungs are clear to auscultation Heart rate is regular rate and rhythm  Resolved problem list   Assessment and Plan  SAH, Non aneurysmal bleed CT of head revealed a subarachnoid hemorrhage centered in the right sylvian fissure and basilar cisterns, greatest on the right but no intraparenchymal hemorrhage, hydrocephalus, or acute infarct.  P: Admit to Neuro ICU for frequent neuro checks.  Discussed with Dr. Gillie Maintain neuro protective measures; goal for eurothermia, euglycemia, eunatermia, normoxia, and PCO2 goal of 35-40 Start nimodipine  HTN P: Cleviprex as needed SBP goal 130-150  HLD  P:  Restart Statin when cleared to take p.o.  Uncontrolled DM Hyperglycemia- CBG > 300  Suspect patient has diabetic retinopathy from uncontrolled DM  No evidence of DKA/HHNK  Last hgb A1c >15 in 6/22  On insulin  70/30-14 units BID daily with  meal, and glimepiride  once daily  P: SSI coverage.  Persistent scale Start Lantus  10 units  Medication Assistance  Patient not taking any medications at home -supposed to be on insulin  70/30  Glimepiride , aspirin , lipitor  P: CSM consult  Diabetes coordinator consult   Critical care time:    The patient is critically ill with multiple organ system failure and requires high complexity decision making for  assessment and support, frequent evaluation and titration of therapies, advanced monitoring, review of radiographic studies and interpretation of complex data.   Critical Care Time devoted to patient care services, exclusive of separately billable procedures, described in this note is 35 minutes.   Daulton Harbaugh MD [facility] See Amion for pager  If no response to pager , please call [phone] until 7pm After 7:00 pm call Elink  [phone] 12/05/2023, 9:59 AM

## 2023-12-05 NOTE — Consult Note (Signed)
 Reason for Consult:non aneurysmal Subarachnoid hemorrhage Referring Physician: CCM  Kimberly Fry is an 58 y.o. female.  HPI: whom presented to the [facility] ED with a day long history of headache and emesis. Head CT showed extensive SAH. CT angio did not reveal an aneurysm, consistent with an MRA done in 2022. Admitted for treatment.  Past Medical History:  Diagnosis Date   Diabetes mellitus without complication (HCC)     Past Surgical History:  Procedure Laterality Date   KNEE SURGERY      Family History  Problem Relation Age of Onset   Heart disease Father     Social History:  reports that she has never smoked. She has never used smokeless tobacco. She reports that she does not drink alcohol and does not use drugs.  Allergies: No Known Allergies  Medications: I have reviewed the patient's current medications.  Results for orders placed or performed during the hospital encounter of 12/04/23 (from the past 48 hours)  CBG monitoring, ED     Status: Abnormal   Collection Time: 12/04/23  4:41 PM  Result Value Ref Range   Glucose-Capillary 369 (H) 70 - 99 mg/dL    Comment: Glucose reference range applies only to samples taken after fasting for at least 8 hours.  CBC     Status: None   Collection Time: 12/04/23  4:54 PM  Result Value Ref Range   WBC 6.7 4.0 - 10.5 K/uL   RBC 4.62 3.87 - 5.11 MIL/uL   Hemoglobin 12.8 12.0 - 15.0 g/dL   HCT 61.5 63.9 - 53.9 %   MCV 83.1 80.0 - 100.0 fL   MCH 27.7 26.0 - 34.0 pg   MCHC 33.3 30.0 - 36.0 g/dL   RDW 87.6 88.4 - 84.4 %   Platelets 189 150 - 400 K/uL   nRBC 0.0 0.0 - 0.2 %    Comment: Performed at [facility], [address] [place], [place] [address]  Troponin T, High Sensitivity     Status: None   Collection Time: 12/04/23  4:54 PM  Result Value Ref Range   Troponin T High Sensitivity <15 0 - 19 ng/L    Comment: (NOTE) Biotin concentrations > 1000 ng/mL falsely decrease TnT results.  Serial  cardiac troponin measurements are suggested.  Refer to the Links section for chest pain algorithms and additional  guidance. Performed at [facility], [address] [place], [place] [address]   Comprehensive metabolic panel     Status: Abnormal   Collection Time: 12/04/23  4:54 PM  Result Value Ref Range   Sodium 137 135 - 145 mmol/L   Potassium 3.7 3.5 - 5.1 mmol/L   Chloride 101 98 - 111 mmol/L   CO2 28 22 - 32 mmol/L   Glucose, Bld 395 (H) 70 - 99 mg/dL    Comment: Glucose reference range applies only to samples taken after fasting for at least 8 hours.   BUN 17 6 - 20 mg/dL   Creatinine, Ser 9.30 0.44 - 1.00 mg/dL   Calcium  9.4 8.9 - 10.3 mg/dL   Total Protein 8.0 6.5 - 8.1 g/dL   Albumin 4.2 3.5 - 5.0 g/dL   AST 26 15 - 41 U/L   ALT 22 0 - 44 U/L   Alkaline Phosphatase 125 38 - 126 U/L   Total Bilirubin 0.4 0.0 - 1.2 mg/dL   GFR, Estimated >39 >39 mL/min    Comment: (NOTE) Calculated using the CKD-EPI  Creatinine Equation (2021)    Anion gap 9 5 - 15    Comment: Performed at [facility], [address], [place], [place] [address]  Lipase, blood     Status: None   Collection Time: 12/04/23  4:54 PM  Result Value Ref Range   Lipase 43 11 - 51 U/L    Comment: Performed at [facility], [address], [place], [place] [address]  Resp panel by RT-PCR (RSV, Flu A&B, Covid) Anterior Nasal Swab     Status: None   Collection Time: 12/04/23  5:12 PM   Specimen: Anterior Nasal Swab  Result Value Ref Range   SARS Coronavirus 2 by RT PCR NEGATIVE NEGATIVE    Comment: (NOTE) SARS-CoV-2 target nucleic acids are NOT DETECTED.  The SARS-CoV-2 RNA is generally detectable in upper respiratory specimens during the acute phase of infection. The lowest concentration of SARS-CoV-2 viral copies this assay can detect is 138 copies/mL. A negative result does not preclude SARS-Cov-2 infection and should not be used as the sole  basis for treatment or other patient management decisions. A negative result may occur with  improper specimen collection/handling, submission of specimen other than nasopharyngeal swab, presence of viral mutation(s) within the areas targeted by this assay, and inadequate number of viral copies(<138 copies/mL). A negative result must be combined with clinical observations, patient history, and epidemiological information. The expected result is Negative.  Fact Sheet for Patients:  bloggercourse.com  Fact Sheet for Healthcare Providers:  seriousbroker.it  This test is no t yet approved or cleared by the [place]  FDA and  has been authorized for detection and/or diagnosis of SARS-CoV-2 by FDA under an Emergency Use Authorization (EUA). This EUA will remain  in effect (meaning this test can be used) for the duration of the COVID-19 declaration under Section 564(b)(1) of the Act, 21 U.S.C.section 360bbb-3(b)(1), unless the authorization is terminated  or revoked sooner.       Influenza A by PCR NEGATIVE NEGATIVE   Influenza B by PCR NEGATIVE NEGATIVE    Comment: (NOTE) The Xpert Xpress SARS-CoV-2/FLU/RSV plus assay is intended as an aid in the diagnosis of influenza from Nasopharyngeal swab specimens and should not be used as a sole basis for treatment. Nasal washings and aspirates are unacceptable for Xpert Xpress SARS-CoV-2/FLU/RSV testing.  Fact Sheet for Patients: bloggercourse.com  Fact Sheet for Healthcare Providers: seriousbroker.it  This test is not yet approved or cleared by the [place]  FDA and has been authorized for detection and/or diagnosis of SARS-CoV-2 by FDA under an Emergency Use Authorization (EUA). This EUA will remain in effect (meaning this test can be used) for the duration of the COVID-19 declaration under Section 564(b)(1) of the Act, 21  U.S.C. section 360bbb-3(b)(1), unless the authorization is terminated or revoked.     Resp Syncytial Virus by PCR NEGATIVE NEGATIVE    Comment: (NOTE) Fact Sheet for Patients: bloggercourse.com  Fact Sheet for Healthcare Providers: seriousbroker.it  This test is not yet approved or cleared by the [place]  FDA and has been authorized for detection and/or diagnosis of SARS-CoV-2 by FDA under an Emergency Use Authorization (EUA). This EUA will remain in effect (meaning this test can be used) for the duration of the COVID-19 declaration under Section 564(b)(1) of the Act, 21 U.S.C. section 360bbb-3(b)(1), unless the authorization is terminated or revoked.  Performed at [facility], [address], [place], [place] [address]   MRSA Next Gen by PCR, Nasal  Status: None   Collection Time: 12/05/23  6:14 AM   Specimen: Nasal Mucosa; Nasal Swab  Result Value Ref Range   MRSA by PCR Next Gen NOT DETECTED NOT DETECTED    Comment: (NOTE) The GeneXpert MRSA Assay (FDA approved for NASAL specimens only), is one component of a comprehensive MRSA colonization surveillance program. It is not intended to diagnose MRSA infection nor to guide or monitor treatment for MRSA infections. Test performance is not FDA approved in patients less than 29 years old. Performed at [facility], [address], [place], [place] [address]   Glucose, capillary     Status: Abnormal   Collection Time: 12/05/23  8:25 AM  Result Value Ref Range   Glucose-Capillary 219 (H) 70 - 99 mg/dL    Comment: Glucose reference range applies only to samples taken after fasting for at least 8 hours.  HIV Antibody (routine testing w rflx)     Status: None   Collection Time: 12/05/23  9:28 AM  Result Value Ref Range   HIV Screen 4th Generation wRfx Non Reactive Non Reactive    Comment: Performed at [facility], [address],  [place], [place] [address]  CBC     Status: None   Collection Time: 12/05/23  9:28 AM  Result Value Ref Range   WBC 7.2 4.0 - 10.5 K/uL   RBC 4.70 3.87 - 5.11 MIL/uL   Hemoglobin 12.8 12.0 - 15.0 g/dL   HCT 60.3 63.9 - 53.9 %   MCV 84.3 80.0 - 100.0 fL   MCH 27.2 26.0 - 34.0 pg   MCHC 32.3 30.0 - 36.0 g/dL   RDW 87.8 88.4 - 84.4 %   Platelets 192 150 - 400 K/uL   nRBC 0.0 0.0 - 0.2 %    Comment: Performed at [facility], [address] [place], [place] [address]  Protime-INR     Status: None   Collection Time: 12/05/23  9:28 AM  Result Value Ref Range   Prothrombin Time 13.9 11.4 - 15.2 seconds   INR 1.0 0.8 - 1.2    Comment: (NOTE) INR goal varies based on device and disease states. Performed at [facility], [address], [place], [place] [address]   APTT     Status: Abnormal   Collection Time: 12/05/23  9:28 AM  Result Value Ref Range   aPTT 23 (L) 24 - 36 seconds    Comment: Performed at [facility], [address], [place], [place] [address]  Magnesium      Status: None   Collection Time: 12/05/23  9:28 AM  Result Value Ref Range   Magnesium  1.8 1.7 - 2.4 mg/dL    Comment: Performed at [facility], [address], [place], [place] [address]  Phosphorus     Status: None   Collection Time: 12/05/23  9:28 AM  Result Value Ref Range   Phosphorus 2.8 2.5 - 4.6 mg/dL    Comment: Performed at [facility], [address], [place], [place] [address]  Hemoglobin A1c     Status: Abnormal   Collection Time: 12/05/23 10:43 AM  Result Value Ref Range   Hgb A1c MFr Bld 9.5 (H) 4.8 - 5.6 %    Comment: (NOTE) Diagnosis of Diabetes The following HbA1c ranges recommended by the American Diabetes Association (ADA) may be used as an aid in the diagnosis of diabetes mellitus.  Hemoglobin  Suggested A1C NGSP%              Diagnosis  <5.7                   Non Diabetic  5.7-6.4                Pre-Diabetic  >6.4                    Diabetic  <7.0                   Glycemic control for                       adults with diabetes.     Mean Plasma Glucose 225.95 mg/dL    Comment: Performed at [facility], [address] [place], [place] [address]  Glucose, capillary     Status: None   Collection Time: 12/05/23 11:47 AM  Result Value Ref Range   Glucose-Capillary 97 70 - 99 mg/dL    Comment: Glucose reference range applies only to samples taken after fasting for at least 8 hours.  Urinalysis, Routine w reflex microscopic -Urine, Clean Catch     Status: Abnormal   Collection Time: 12/05/23  2:57 PM  Result Value Ref Range   Color, Urine YELLOW YELLOW   APPearance TURBID (A) CLEAR   Specific Gravity, Urine 1.014 1.005 - 1.030   pH 5.0 5.0 - 8.0   Glucose, UA 50 (A) NEGATIVE mg/dL   Hgb urine dipstick SMALL (A) NEGATIVE   Bilirubin Urine NEGATIVE NEGATIVE   Ketones, ur 5 (A) NEGATIVE mg/dL   Protein, ur 30 (A) NEGATIVE mg/dL   Nitrite NEGATIVE NEGATIVE   Leukocytes,Ua LARGE (A) NEGATIVE   RBC / HPF >50 0 - 5 RBC/hpf   WBC, UA >50 0 - 5 WBC/hpf   Bacteria, UA MANY (A) NONE SEEN   Squamous Epithelial / HPF 0-5 0 - 5 /HPF   WBC Clumps PRESENT    Mucus PRESENT     Comment: Performed at [facility], [address] [place], [place] [address]  Glucose, capillary     Status: Abnormal   Collection Time: 12/05/23  2:58 PM  Result Value Ref Range   Glucose-Capillary 194 (H) 70 - 99 mg/dL    Comment: Glucose reference range applies only to samples taken after fasting for at least 8 hours.    CT ANGIO HEAD W OR WO CONTRAST Result Date: 12/04/2023 EXAM: CTA Head without and with Intravenous Contrast CLINICAL HISTORY: TECHNIQUE: Axial CTA images of the head without and with intravenous contrast. MIP reconstructed images were created and reviewed. Dose reduction technique was used including one or more of the following: automated exposure control, adjustment of mA and kV according to patient size, and/or  iterative reconstruction. CONTRAST: Without and with; 75 mL iohexol  (OMNIPAQUE ) 350 MG/ML injection 75 mL IOHEXOL  350 MG/ML SOLN. COMPARISON: CT performed earlier the same day as well as prior MRI from 07/25/2020. FINDINGS: INTERNAL CAROTID ARTERIES: Minimal atherosclerotic change about the intracranial siphons without stenosis or other abnormality. No occlusion. No aneurysm. ANTERIOR CEREBRAL ARTERIES: Right A1 segment dominant and widely patent. Left A1 hypoplastic and grossly patent as well. Normal anterior communicating artery complex. Both ACAs patent without stenosis. No A1 stenosis or occlusion. Distal anterior branches perfused and symmetric. No aneurysm. MIDDLE CEREBRAL ARTERIES: Normal MCA bifurcations. No significant stenosis. No occlusion. No aneurysm. POSTERIOR CEREBRAL ARTERIES:  Both PCAs are primarily supplied via the basilar artery. PCAs are patent in their distal aspects without stenosis. No aneurysm. BASILAR ARTERY: Basilar artery patent without stenosis. No occlusion. No aneurysm. VERTEBRAL ARTERIES: Both vertebral arteries patent without stenosis. Right vertebral artery dominant. Both PICAs patent. No occlusion. No aneurysm. SOFT TISSUES: No acute finding. No masses or lymphadenopathy. BONES: No acute osseous abnormality. Superior cerebellar arteries patent bilaterally. No vascular beading or irregularity. No intracranial aneurysm or other vascular malformation identified to explain the acute event. IMPRESSION: 1. Stable and normal CTA of the head. No intracranial aneurysm or other vascular abnormality identified to explain the acute subarachnoid hemorrhage. 2. No large vessel occlusion. No hemodynamically significant or correctable stenosis. Electronically signed by: Morene Hoard MD 12/04/2023 11:49 PM EDT RP Workstation: [id number]   CT Angio Chest/Abd/Pel for Dissection W and/or Wo Contrast Result Date: 12/04/2023 EXAM: CTA CHEST, ABDOMEN AND PELVIS WITHOUT AND WITH CONTRAST  12/04/2023 09:05:13 PM TECHNIQUE: CTA of the chest was performed without and with the administration of 100 mL of iohexol  (OMNIPAQUE ) 350 MG/ML intravenous contrast. CTA of the abdomen and pelvis was performed without and with the administration of 100 mL of iohexol  (OMNIPAQUE ) 350 MG/ML intravenous contrast. Multiplanar reformatted images are provided for review. MIP images are provided for review. Automated exposure control, iterative reconstruction, and/or weight based adjustment of the mA/kV was utilized to reduce the radiation dose to as low as reasonably achievable. COMPARISON: 07/25/2020 CLINICAL HISTORY: Acute aortic syndrome (AAS) suspected; CP/Abd pain, Hypertensive. FINDINGS: VASCULATURE: AORTA: No acute finding. No abdominal aortic aneurysm. No dissection. PULMONARY ARTERIES: No pulmonary embolism with the limits of this exam. GREAT VESSELS OF AORTIC ARCH: No acute finding. No dissection. No arterial occlusion or significant stenosis. CELIAC TRUNK: No acute finding. No occlusion or significant stenosis. SUPERIOR MESENTERIC ARTERY: No acute finding. No occlusion or significant stenosis. INFERIOR MESENTERIC ARTERY: No acute finding. No occlusion or significant stenosis. RENAL ARTERIES: No acute finding. No occlusion or significant stenosis. ILIAC ARTERIES: No acute finding. No occlusion or significant stenosis. CHEST: MEDIASTINUM: No mediastinal lymphadenopathy. The heart and pericardium demonstrate no acute abnormality. LUNGS AND PLEURA: The lungs are without acute process. No focal consolidation or pulmonary edema. No evidence of pleural effusion or pneumothorax. THORACIC BONES AND SOFT TISSUES: No acute bone or soft tissue abnormality. ABDOMEN AND PELVIS: LIVER: The liver is unremarkable. GALLBLADDER AND BILE DUCTS: Gallbladder is unremarkable. No biliary ductal dilatation. SPLEEN: The spleen is unremarkable. PANCREAS: The pancreas is unremarkable. ADRENAL GLANDS: Bilateral adrenal glands demonstrate no  acute abnormality. KIDNEYS, URETERS AND BLADDER: No stones in the kidneys or ureters. No hydronephrosis. No perinephric or periureteral stranding. Urinary bladder is unremarkable. GI AND BOWEL: Stomach and duodenal sweep demonstrate no acute abnormality. There is no bowel obstruction. No abnormal bowel wall thickening or distension. Large stool burden throughout the colon. REPRODUCTIVE: Reproductive organs are unremarkable. PERITONEUM AND RETROPERITONEUM: No ascites or free air. LYMPH NODES: No lymphadenopathy. ABDOMINAL BONES AND SOFT TISSUES: No acute abnormality of the bones. No acute soft tissue abnormality. IMPRESSION: 1. No evidence of acute aortic syndrome. 2. Large stool burden throughout the colon. Electronically signed by: Franky Crease MD 12/04/2023 09:18 PM EDT RP Workstation: [id number]   CT Head Wo Contrast Result Date: 12/04/2023 EXAM: CT HEAD WITHOUT CONTRAST 12/04/2023 09:05:13 PM TECHNIQUE: CT of the head was performed without the administration of intravenous contrast. Automated exposure control, iterative reconstruction, and/or weight based adjustment of the mA/kV was utilized to reduce the radiation dose to as low as reasonably  achievable. COMPARISON: MRI 07/25/2020 CLINICAL HISTORY: Headache, increasing frequency or severity. FINDINGS: BRAIN AND VENTRICLES: Subarachnoid blood noted in the right sylvian fissure and basilar cisterns most pronounced on the right. No intraparenchymal hemorrhage. No acute infarct. No hydrocephalus. No mass effect or midline shift. ORBITS: No acute abnormality. SINUSES: No acute abnormality. SOFT TISSUES AND SKULL: No acute soft tissue abnormality. No skull fracture. IMPRESSION: 1. Subarachnoid hemorrhage centered in the right sylvian fissure and basilar cisterns, greatest on the right. 2. No intraparenchymal hemorrhage, hydrocephalus, or acute infarct. 3. Results were called to Dr. Ula at the time of interpretation. Electronically signed by: Franky Crease MD  12/04/2023 09:14 PM EDT RP Workstation: [id number]    Review of Systems Blood pressure 113/63, pulse 68, temperature 99.7 F (37.6 C), temperature source Oral, resp. rate 15, height 5' 4 (1.626 m), weight 49.1 kg, SpO2 96%. Physical Exam HENT:     Head: Normocephalic and atraumatic.     Right Ear: External ear normal.     Left Ear: External ear normal.     Mouth/Throat:     Mouth: Mucous membranes are moist.     Pharynx: Oropharynx is clear.  Eyes:     Extraocular Movements: Extraocular movements intact.     Conjunctiva/sclera: Conjunctivae normal.     Pupils: Pupils are equal, round, and reactive to light.  Cardiovascular:     Rate and Rhythm: Normal rate and regular rhythm.     Pulses: Normal pulses.  Pulmonary:     Effort: Pulmonary effort is normal.  Musculoskeletal:     Cervical back: Normal range of motion.  Neurological:     Mental Status: She is lethargic.     Cranial Nerves: Cranial nerves 2-12 are intact.     Sensory: Sensation is intact.     Motor: Motor function is intact.     Deep Tendon Reflexes: Babinski sign absent on the right side. Babinski sign absent on the left side.     Assessment/Plan: Kimberly Fry is a 58 y.o. female With a non aneurysmal SAH. Will need a repeat angio in 5-7 days. Does have ventriculomegaly but is following commands. Currently no indication that she needs a ventricular catheter placed. Recommend nimodipine, iv hydration, and blood pressure parameters 90-140 systolic.   Rockey Peru 12/05/2023, 4:32 PM

## 2023-12-05 NOTE — Progress Notes (Signed)
 PCCM interval note  UA looks dirty Ordering urine culture.  Start ceftriaxone  Lonna Coder MD [facility] 12/05/2023, 4:06 PM

## 2023-12-05 NOTE — ED Notes (Signed)
 Report received by previous RN. Was found to have removed 18G LAC IV. Cleviprex restarted to 20G RAC. Pt reconnected to VS monitor. Pt remains confused. Has an episode of urinary incontinence - brief removed and new bed pad provided. On assessment pt is able to tell us  her name, DOB, believed to be 58 years old. Sts she is at a church and c/o mild headache and abd discomfort. Able to follow instructions. PEARRL.

## 2023-12-05 NOTE — ED Notes (Signed)
 Neuro assessment findings were discussed with [facility] EDP [facility]. He reports he was informed pt was confused at baseline. Confused worsened after a fall which brought pt to ED.

## 2023-12-05 NOTE — ED Notes (Signed)
 Pt was found standing at the edge of the bed in an attempt to go to the bathroom. Has urinated over edge of bed and floor. Was assisted back in bed. New sheets, gown, and blankets provided. Given new nonslip socks. Placed on a posey sitter alarm. Re-oriented pt to situation

## 2023-12-05 NOTE — Progress Notes (Signed)
 eLink Physician-Brief Progress Note Patient Name: Kimberly Fry DOB: 06/23/1965 MRN: [id number]   Date of Service  12/05/2023  HPI/Events of Note  Notified that 2300 BG 453.  At 1900 was 145.  eICU Interventions  Having nurse repeat to assure accuracy.  If accurate will give SSI and long acting insulin  that was held earlier today        CLAUDENE AGENT, [place] 12/05/2023, 11:39 PM

## 2023-12-05 NOTE — Progress Notes (Signed)
 Echocardiogram 2D Echocardiogram has been performed.  Kimberly Fry 12/05/2023, 1:37 PM

## 2023-12-05 NOTE — Progress Notes (Signed)
 PT Cancellation Note  Patient Details Name: Kimberly Fry MRN: [id number] DOB: 11/19/1965   Cancelled Treatment:    Reason Eval/Treat Not Completed: (P) Medical issues which prohibited therapy. Pt presenting with assumed nontraumatic SAH yesterday around 16:39. Per therapy protocol, will hold off until > 24 hours since presentation with nontraumatic brain bleed unless cleared by MD. Confirmed with Dr. Theophilus to hold off at this time. Will plan to follow-up tomorrow as able.   Theo Ferretti, PT, DPT Acute Rehabilitation Services  Office: [phone]    Theo CHRISTELLA Ferretti 12/05/2023, 7:41 AM

## 2023-12-05 NOTE — Progress Notes (Signed)
 OT Cancellation Note  Patient Details Name: Kimberly Fry MRN: [id number] DOB: [birth year]   Cancelled Treatment:    Reason Eval/Treat Not Completed: Medical issues which prohibited therapy: Pt presenting with assumed nontraumatic SAH yesterday around 16:39. Per therapy protocol, will hold off until > 24 hours since presentation with nontraumatic brain bleed unless cleared by MD. Confirmed with Dr. Theophilus to hold off at this time. Will plan to follow-up tomorrow as able.   Kimberly Fry, OTD, OTR/L SecureChat Preferred Acute Rehab [phone]   Kimberly Fry 12/05/2023, 7:47 AM

## 2023-12-06 ENCOUNTER — Inpatient Hospital Stay (HOSPITAL_COMMUNITY)

## 2023-12-06 DIAGNOSIS — I609 Nontraumatic subarachnoid hemorrhage, unspecified: Secondary | ICD-10-CM | POA: Diagnosis not present

## 2023-12-06 DIAGNOSIS — N390 Urinary tract infection, site not specified: Secondary | ICD-10-CM

## 2023-12-06 DIAGNOSIS — E876 Hypokalemia: Secondary | ICD-10-CM

## 2023-12-06 LAB — GLUCOSE, CAPILLARY
Glucose-Capillary: 66 mg/dL — ABNORMAL LOW (ref 70–99)
Glucose-Capillary: 80 mg/dL (ref 70–99)
Glucose-Capillary: 189 mg/dL — ABNORMAL HIGH (ref 70–99)
Glucose-Capillary: 395 mg/dL — ABNORMAL HIGH (ref 70–99)
Glucose-Capillary: 110 mg/dL — ABNORMAL HIGH (ref 70–99)
Glucose-Capillary: 155 mg/dL — ABNORMAL HIGH (ref 70–99)
Glucose-Capillary: 212 mg/dL — ABNORMAL HIGH (ref 70–99)

## 2023-12-06 LAB — BASIC METABOLIC PANEL WITH GFR
Anion gap: 12 (ref 5–15)
BUN: 7 mg/dL (ref 6–20)
CO2: 22 mmol/L (ref 22–32)
Calcium: 8 mg/dL — ABNORMAL LOW (ref 8.9–10.3)
Chloride: 108 mmol/L (ref 98–111)
Creatinine, Ser: 0.57 mg/dL (ref 0.44–1.00)
GFR, Estimated: >60 mL/min (ref >60)
Glucose, Bld: 142 mg/dL — ABNORMAL HIGH (ref 70–99)
Potassium: 2.9 mmol/L — ABNORMAL LOW (ref 3.5–5.1)
Sodium: 142 mmol/L (ref 135–145)

## 2023-12-06 LAB — CBC
HCT: 34.9 % — ABNORMAL LOW (ref 36.0–46.0)
Hemoglobin: 11.8 g/dL — ABNORMAL LOW (ref 12.0–15.0)
MCH: 27.3 pg (ref 26.0–34.0)
MCHC: 33.8 g/dL (ref 30.0–36.0)
MCV: 80.8 fL (ref 80.0–100.0)
Platelets: 184 K/uL (ref 150–400)
RBC: 4.32 MIL/uL (ref 3.87–5.11)
RDW: 12.2 % (ref 11.5–15.5)
WBC: 6.4 K/uL (ref 4.0–10.5)
nRBC: 0 % (ref 0.0–0.2)

## 2023-12-06 LAB — MAGNESIUM: Magnesium: 1.9 mg/dL (ref 1.7–2.4)

## 2023-12-06 MED ORDER — LEVETIRACETAM 500 MG PO TABS
500.0000 mg | ORAL_TABLET | Freq: Two times a day (BID) | ORAL | Status: DC
  Administered 2023-12-06 – 2023-12-11 (×11): 500 mg via ORAL
  Filled 2023-12-06 (×11): qty 1

## 2023-12-06 MED ORDER — INSULIN GLARGINE-YFGN 100 UNIT/ML ~~LOC~~ SOLN
5.0000 [IU] | Freq: Every day | SUBCUTANEOUS | Status: DC
  Administered 2023-12-06: 5 [IU] via SUBCUTANEOUS
  Filled 2023-12-06: qty 0.05

## 2023-12-06 MED ORDER — INSULIN GLARGINE-YFGN 100 UNIT/ML ~~LOC~~ SOLN
5.0000 [IU] | Freq: Two times a day (BID) | SUBCUTANEOUS | Status: DC
  Administered 2023-12-06: 5 [IU] via SUBCUTANEOUS
  Filled 2023-12-06 (×3): qty 0.05

## 2023-12-06 MED ORDER — POTASSIUM CHLORIDE CRYS ER 20 MEQ PO TBCR
20.0000 meq | EXTENDED_RELEASE_TABLET | Freq: Once | ORAL | Status: AC
  Administered 2023-12-06: 20 meq via ORAL
  Filled 2023-12-06: qty 1

## 2023-12-06 MED ORDER — MAGNESIUM SULFATE IN D5W 1-5 GM/100ML-% IV SOLN
1.0000 g | Freq: Once | INTRAVENOUS | Status: AC
  Administered 2023-12-06: 1 g via INTRAVENOUS
  Filled 2023-12-06: qty 100

## 2023-12-06 MED ORDER — LABETALOL HCL 5 MG/ML IV SOLN: 10.0000 mg | INTRAVENOUS | Status: DC | PRN

## 2023-12-06 MED ORDER — POTASSIUM CHLORIDE CRYS ER 20 MEQ PO TBCR
40.0000 meq | EXTENDED_RELEASE_TABLET | Freq: Once | ORAL | Status: AC
  Administered 2023-12-06: 40 meq via ORAL
  Filled 2023-12-06: qty 2

## 2023-12-06 MED ORDER — FAMOTIDINE 20 MG PO TABS: 20.0000 mg | ORAL_TABLET | Freq: Every day | ORAL | Status: DC

## 2023-12-06 MED ORDER — ATORVASTATIN CALCIUM 40 MG PO TABS
40.0000 mg | ORAL_TABLET | Freq: Every day | ORAL | Status: DC
  Administered 2023-12-06 – 2023-12-11 (×6): 40 mg via ORAL
  Filled 2023-12-06 (×6): qty 1

## 2023-12-06 NOTE — TOC Initial Note (Signed)
 Transition of Care [facility]) - Initial/Assessment Note    Patient Details  Name: Kimberly Fry MRN: [id number] Date of Birth: 01-Feb-1966  Transition of Care [facility]) CM/SW Contact:    Josepha Mliss HERO, RN Phone Number: 12/06/2023, 3:54 PM  Clinical Narrative:                 58 yo female presents to ED 11/1 with x1 day headache and nausea/vomiting. CTH shows SAH centered in R sylvian fissure and basilar cisterns R>L. PMH includes diabetes, hypertension, and hyperlipidemia.  PTA, pt independent and living at home with son and daughter.  PT/OT recommending no OP follow up.  Patient noncompliant with medications and has no PCP.  Will arrange hospital follow up/PCP appt with patient.  Noted patient was admitted in June and PCP appt scheduled with one of the community clinics for patient; she was also given diabetic supplies by Diabetes Coordinator. TOC Case Manager provided medication assistance through [facility] program, so she is not eligible for assistance this admission.   Will arrange another follow up appt at one of [place] community clinics.  Diabetes Coordinator to follow.   Expected Discharge Plan: Home/Self Care Barriers to Discharge: Continued Medical Work up              Expected Discharge Plan and Services   Discharge Planning Services: CM Consult, Follow-up appt scheduled   Living arrangements for the past 2 months: Single Family Home                                      Prior Living Arrangements/Services Living arrangements for the past 2 months: Single Family Home Lives with:: Adult Children Patient language and need for interpreter reviewed:: Yes        Need for Family Participation in Patient Care: Yes (Comment) Care giver support system in place?: Yes (comment)   Criminal Activity/Legal Involvement Pertinent to Current Situation/Hospitalization: No - Comment as needed  Activities of Daily Living   ADL Screening (condition at time of  admission) Independently performs ADLs?: No Does the patient have a NEW difficulty with bathing/dressing/toileting/self-feeding that is expected to last >3 days?: No Does the patient have a NEW difficulty with getting in/out of bed, walking, or climbing stairs that is expected to last >3 days?: Yes (Initiates electronic notice to provider for possible PT consult) Does the patient have a NEW difficulty with communication that is expected to last >3 days?: Yes (Initiates electronic notice to provider for possible SLP consult) Is the patient deaf or have difficulty hearing?: No Does the patient have difficulty seeing, even when wearing glasses/contacts?: Yes Does the patient have difficulty concentrating, remembering, or making decisions?: Yes      Orientation: : Oriented to Self, Oriented to Place, Oriented to  Time, Oriented to Situation      Admission diagnosis:  Subarachnoid hemorrhage (HCC) [I60.9] SAH (subarachnoid hemorrhage) (HCC) [I60.9] Patient Active Problem List   Diagnosis Date Noted   SAH (subarachnoid hemorrhage) (HCC) 12/05/2023   Subarachnoid hemorrhage (HCC) 12/04/2023   Prolonged QT interval 07/25/2020   Chest pain 07/24/2020   Syncope 07/24/2020   Type 2 diabetes mellitus with hyperglycemia, without long-term current use of insulin  (HCC) 07/24/2020   Elevated troponin 07/24/2020   PCP:  Pcp, No Pharmacy:   [facility] - [place] ([place]), [place] - [address] [place] ([place]) [place]  [address] Phone: [phone] Fax: [phone]  [facility] [address] [place] [address] Phone: [phone] Fax: [phone]     Social Drivers of Health (SDOH) Social History: SDOH Screenings   Tobacco Use: Low Risk  (12/04/2023)   SDOH Interventions:     Readmission Risk Interventions     No data to display         Mliss MICAEL Fass, RN, BSN  Trauma/Neuro ICU Case Manager [phone]

## 2023-12-06 NOTE — Progress Notes (Signed)
 Patient ID: Kimberly Fry, female   DOB: [birth year], 58 y.o.   MRN: [id number] BP 127/79   Pulse 65   Temp 98.5 F (36.9 C) (Oral)   Resp (!) 22   Ht 5' 4 (1.626 m)   Wt 49.1 kg   SpO2 98%   BMI 18.58 kg/m  Alert, speech is clear and fluent Moving all extremities well Perrl, full eom Tongue and uvula midline Improved since yesterday.  No current indications for ventricular catheter placement Waiting for repeat angio

## 2023-12-06 NOTE — Evaluation (Signed)
 Physical Therapy Evaluation Patient Details Name: Zakari Couchman MRN: [id number] DOB: May 13, 1965 Today's Date: 12/06/2023  History of Present Illness  58 yo female presents to ED 11/1 with x1 day headache and nausea/vomiting. CTH shows SAH centered in R sylvian fissure and basilar cisterns R>L. PMH includes  diabetes, hypertension, and hyperlipidemia.  Clinical Impression  Pt presents with mild impaired standing balance, neck pain, and decreased activity tolerance vs pt's anticipated baseline. Pt to benefit from acute PT to address deficits. Pt ambulated good hallway distance without AD, demonstrates mild L/R drift with L/R head turns but anticipate this could be baseline, as pt states she has had burning and numbness in her legs for >1 year, would recommend OP neurology workup for this given history of DMII. Would benefit from increased supervision from family at d/c to ensure pt safety. PT does not anticipate any follow up PT needs post-acutely. PT to progress mobility as tolerated, and will continue to follow acutely.          If plan is discharge home, recommend the following: Other (comment) (would benefit from supervision from family)   Can travel by private vehicle        Equipment Recommendations None recommended by PT  Recommendations for Other Services       Functional Status Assessment Patient has not had a recent decline in their functional status     Precautions / Restrictions Precautions Precautions: Fall Restrictions Weight Bearing Restrictions Per Provider Order: No      Mobility  Bed Mobility Overal bed mobility: Needs Assistance             General bed mobility comments: up with OT    Transfers Overall transfer level: Needs assistance Equipment used: None Transfers: Sit to/from Stand Sit to Stand: Supervision           General transfer comment: for safety, no physical assist for stand or sit    Ambulation/Gait Ambulation/Gait  assistance: Supervision Gait Distance (Feet): 200 Feet Assistive device: None Gait Pattern/deviations: Step-through pattern, Decreased stride length, Drifts right/left Gait velocity: decr     General Gait Details: for safety, min L/R drifting but suspect this may be baseline as this PT suspects peripheral neuropathy  Stairs            Wheelchair Mobility     Tilt Bed    Modified Rankin (Stroke Patients Only) Modified Rankin (Stroke Patients Only) Pre-Morbid Rankin Score: No symptoms Modified Rankin: Slight disability     Balance Overall balance assessment: Needs assistance                               Standardized Balance Assessment Standardized Balance Assessment : Dynamic Gait Index   Dynamic Gait Index Level Surface: Normal Change in Gait Speed: Mild Impairment Gait with Horizontal Head Turns: Mild Impairment Gait with Vertical Head Turns: Mild Impairment       Pertinent Vitals/Pain Pain Assessment Pain Assessment: Faces Faces Pain Scale: Hurts little more Pain Location: nape of neck Pain Descriptors / Indicators: Discomfort, Grimacing, Guarding Pain Intervention(s): Limited activity within patient's tolerance, Monitored during session, Repositioned    Home Living Family/patient expects to be discharged to:: Private residence Living Arrangements: Children Available Help at Discharge: Family;Available 24 hours/day Type of Home: House (mother-in-law apt/suite off of house) Home Access: Stairs to enter Entrance Stairs-Rails: None Entrance Stairs-Number of Steps: 2 (small)   Home Layout: One level Home Equipment: None  Prior Function Prior Level of Function : Independent/Modified Independent                     Extremity/Trunk Assessment   Upper Extremity Assessment Upper Extremity Assessment: Defer to OT evaluation    Lower Extremity Assessment Lower Extremity Assessment: Generalized weakness;LLE deficits/detail;RLE  deficits/detail (at least 4/5 throughout on MMT) RLE Deficits / Details: pt endorsing burning and numbness x1 year from knees down RLE Sensation: decreased light touch LLE Deficits / Details: pt endorsing burning and numbness x1 year from knees down LLE Sensation: decreased light touch    Cervical / Trunk Assessment Cervical / Trunk Assessment: Normal  Communication   Communication Communication: Impaired Factors Affecting Communication: Other (comment) (spanish speaking, utilized interpreter Erick via ipad interpreting services)    Cognition Arousal: Alert Behavior During Therapy: WFL for tasks assessed/performed   PT - Cognitive impairments: No apparent impairments                         Following commands: Intact       Cueing Cueing Techniques: Verbal cues     General Comments General comments (skin integrity, edema, etc.): PT reviewed BE FAST principles, pt expressing understanding    Exercises     Assessment/Plan    PT Assessment Patient needs continued PT services  PT Problem List Decreased strength;Decreased mobility;Decreased activity tolerance;Decreased balance;Decreased knowledge of use of DME;Impaired sensation;Decreased safety awareness;Pain       PT Treatment Interventions DME instruction;Therapeutic activities;Gait training;Therapeutic exercise;Patient/family education;Balance training;Stair training;Functional mobility training;Neuromuscular re-education    PT Goals (Current goals can be found in the Care Plan section)  Acute Rehab PT Goals PT Goal Formulation: With patient Time For Goal Achievement: 12/20/23 Potential to Achieve Goals: Good    Frequency Min 2X/week     Co-evaluation               AM-PAC PT 6 Clicks Mobility  Outcome Measure Help needed turning from your back to your side while in a flat bed without using bedrails?: None Help needed moving from lying on your back to sitting on the side of a flat bed without  using bedrails?: None Help needed moving to and from a bed to a chair (including a wheelchair)?: A Little Help needed standing up from a chair using your arms (e.g., wheelchair or bedside chair)?: A Little Help needed to walk in hospital room?: A Little Help needed climbing 3-5 steps with a railing? : A Little 6 Click Score: 20    End of Session Equipment Utilized During Treatment: Gait belt Activity Tolerance: Patient tolerated treatment well Patient left: in chair;with call bell/phone within reach;Other (comment) (chair was already set up upon PT arrival to room without alarm, RN notified that pt up in chair without alarm set up) Nurse Communication: Mobility status PT Visit Diagnosis: Other abnormalities of gait and mobility (R26.89);Muscle weakness (generalized) (M62.81)    Time: 1015-1040 PT Time Calculation (min) (ACUTE ONLY): 25 min   Charges:   PT Evaluation $PT Eval Low Complexity: 1 Low PT Treatments $Gait Training: 8-22 mins PT General Charges $$ ACUTE PT VISIT: 1 Visit         Johana RAMAN, PT DPT Acute Rehabilitation Services Secure Chat Preferred  Office [phone]   Sheppard Luckenbach E Stroup 12/06/2023, 12:01 PM

## 2023-12-06 NOTE — Progress Notes (Signed)
 Transcranial Doppler  Date POD PCO2 HCT BP  MCA ACA PCA OPHT SIPH VERT Basilar  11/3 JH   34.9 133/72 Right  Left   *  *   *  *   *  *   17  16   *  *   -40  -33   -42           Right  Left                                            Right  Left                                             Right  Left                                             Right  Left                                            Right  Left                                            Right  Left                                        MCA = Middle Cerebral Artery      OPHT = Opthalmic Artery     BASILAR = Basilar Artery   ACA = Anterior Cerebral Artery     SIPH = Carotid Siphon PCA = Posterior Cerebral Artery   VERT = Verterbral Artery                   Normal MCA = 62+\-12 ACA = 50+\-12 PCA = 42+\-23    Results can be found under chart review under CV PROC. 12/06/2023 5:36 PM Rodman Recupero RVT, RDMS

## 2023-12-06 NOTE — Evaluation (Signed)
 Occupational Therapy Evaluation Patient Details Name: Kimberly Fry MRN: [id number] DOB: [birth year].eval  Today's Date: 12/06/2023   History of Present Illness   58 yo female presents to ED 11/1 with x1 day headache and nausea/vomiting. CTH shows SAH centered in R sylvian fissure and basilar cisterns R>L. PMH includes  diabetes, hypertension, and hyperlipidemia.     Clinical Impressions Kimberly Fry was evaluated s/p the above admission list. She is indep at baseline and lives in a suite/apt off of her daughters home. Upon evaluation the pt was limited by mildly unsteady balance, reduced activity tolerance and baseline low vision. Overall she needed CGA for tranfers initially but quickly progressed to superivsion A without AD during ADLs and functional mobility. Due to the deficits listed below the pt also needs generalized superivsion A for ADLs. Pt did report mild dizziness at the start of the session. She had 2x mild LOBs and self corrected appropriately. Pt will benefit from continued acute OT services and increased assist from family at home initially.        If plan is discharge home, recommend the following:   A little help with walking and/or transfers;A little help with bathing/dressing/bathroom;Assistance with cooking/housework;Assist for transportation     Functional Status Assessment   Patient has had a recent decline in their functional status and demonstrates the ability to make significant improvements in function in a reasonable and predictable amount of time.     Equipment Recommendations   None recommended by OT      Precautions/Restrictions   Precautions Precautions: Fall Restrictions Weight Bearing Restrictions Per Provider Order: No     Mobility Bed Mobility Overal bed mobility: Needs Assistance Bed Mobility: Supine to Sit     Supine to sit: Supervision          Transfers Overall transfer level: Needs assistance Equipment used:  None Transfers: Sit to/from Stand Sit to Stand: Supervision           General transfer comment: CGA given initially for safety, pt quickly progressed to superivsion A during ADLs      Balance Overall balance assessment: Mild deficits observed, not formally tested         ADL either performed or assessed with clinical judgement   ADL Overall ADL's : Needs assistance/impaired         General ADL Comments: generalized supervision A for all ADLs, no phyiscal assist required. Pt did have 2x mild LOBs but she was able to self correct. Pt noted to use some low vision strategies     Vision Baseline Vision/History: 0 No visual deficits Vision Assessment?: No apparent visual deficits     Perception Perception: Within Functional Limits       Praxis Praxis: WFL       Pertinent Vitals/Pain Pain Assessment Pain Assessment: Faces Faces Pain Scale: Hurts a little bit Pain Location: dizzy Pain Descriptors / Indicators: Discomfort Pain Intervention(s): Limited activity within patient's tolerance, Monitored during session     Extremity/Trunk Assessment Upper Extremity Assessment Upper Extremity Assessment: Overall WFL for tasks assessed   Lower Extremity Assessment Lower Extremity Assessment: Defer to PT evaluation   Cervical / Trunk Assessment Cervical / Trunk Assessment: Normal   Communication Communication Communication: Impaired Factors Affecting Communication: Non - English speaking, interpreter not available   Cognition Arousal: Alert Behavior During Therapy: WFL for tasks assessed/performed Cognition: No apparent impairments             OT - Cognition Comments: overall WFL, difficult to fully  assess due to language barrier                 Following commands: Intact       Cueing  General Comments   Cueing Techniques: Verbal cues  VSS on RA           Home Living Family/patient expects to be discharged to:: Private residence Living  Arrangements: Children Available Help at Discharge: Family;Available 24 hours/day Type of Home: House (mother-in-law apt/suite off of house) Home Access: Stairs to enter Secretary/administrator of Steps: 2 (small) Entrance Stairs-Rails: None Home Layout: One level     Bathroom Shower/Tub: Chief Strategy Officer: Standard     Home Equipment: None          Prior Functioning/Environment Prior Level of Function : Independent/Modified Independent          OT Problem List: Decreased range of motion;Decreased activity tolerance;Impaired balance (sitting and/or standing)   OT Treatment/Interventions: Self-care/ADL training;Therapeutic exercise;DME and/or AE instruction;Therapeutic activities;Balance training;Patient/family education      OT Goals(Current goals can be found in the care plan section)   Acute Rehab OT Goals Patient Stated Goal: home today OT Goal Formulation: With patient Time For Goal Achievement: 12/20/23 Potential to Achieve Goals: Good ADL Goals Additional ADL Goal #1: Pt will complete all ADLs independently   OT Frequency:  Min 2X/week       AM-PAC OT 6 Clicks Daily Activity     Outcome Measure Help from another person eating meals?: None Help from another person taking care of personal grooming?: A Little Help from another person toileting, which includes using toliet, bedpan, or urinal?: A Little Help from another person bathing (including washing, rinsing, drying)?: A Little Help from another person to put on and taking off regular upper body clothing?: A Little Help from another person to put on and taking off regular lower body clothing?: A Little 6 Click Score: 19   End of Session Nurse Communication: Mobility status  Activity Tolerance: Patient tolerated treatment well Patient left: Other (comment) (hand off to PT)  OT Visit Diagnosis: Unsteadiness on feet (R26.81);Other abnormalities of gait and mobility (R26.89);Muscle  weakness (generalized) (M62.81)                Time: 9044-8984 OT Time Calculation (min): 20 min Charges:  OT General Charges $OT Visit: 1 Visit OT Evaluation $OT Eval Moderate Complexity: 1 Mod  Lucie Kendall, OTR/L Acute Rehabilitation Services Office [phone] Secure Chat Communication Preferred   Lucie JONETTA Kendall 12/06/2023, 10:30 AM

## 2023-12-06 NOTE — Progress Notes (Signed)
 NAME:  Kimberly Fry, MRN:  [id number], DOB:  [birth year], LOS: 2 ADMISSION DATE:  12/04/2023, CONSULTATION DATE:  12/04/23 REFERRING MD: Prentice Medicus MD CHIEF COMPLAINT:  Chest Pain/Abdominal pain   History of Present Illness:  Pt is a 41 yr spanish speaking female with significant pmhx of diabetes, HTN, and HLD presenting to [facility] ED for complaints of chest pain/abdominal pain which began earlier this morning, she had been her usual state of health the day before. Patient reports that she has had an ongoing headache with intermittent vomiting. Upon initial work up in the ED, CT of head revealed a subarachnoid hemorrhage centered in the right sylvian fissure and basilar cisterns, greatest on the right but no intraparenchymal hemorrhage, hydrocephalus, or acute infarct. CT Angio Chest/abdomen/pelvis obtained to rule out aortic dissection which was negative for but did reveal large amount of stool burden in colon.   Upon arrival to [facility] Neuro ICU, patient off cleviprex. SBP 110s-130s. Utilized translator to assist with obtaining history. Patient stated that she hit her head on a piece of furniture about 8 days ago. Patient stated she did not fall but has issues with her vision due to her diabetes. She endorses that she can only see shadows nothing more. Per patient, her eye sight has been worsening over the last 1 yr. She is supposed to be on insulin  70/30 at home, along with glimepiride  but patient states she was only given these medications in the hospital. Patient mentions she lives with daughter at home and that is who brought her to the hospital. Prior to yesterday 11/1, patient was in her usual state of health. Patient confirms that headache and neck pain were a 10/10 but now a 6. Pain has improved.   Pertinent  Medical History   Past Medical History:  Diagnosis Date   Diabetes mellitus without complication (HCC)   HTN HLD   Significant Hospital Events: Including procedures,  antibiotic start and stop dates in addition to other pertinent events   12/04/23 PCCM Admit, patient with SAH  Interim History / Subjective:  Per video interpreter, slept poorly, ongoing headache and neck pain improved with tylenol , ongoing dizziness, and burning in epigastric area but hungry and wanting breakfast.  Some ongoing urinary urgency but now resolved burning/ dysuria.    Objective    Blood pressure (!) 109/55, pulse 62, temperature 98.1 F (36.7 C), temperature source Oral, resp. rate 14, height 5' 4 (1.626 m), weight 49.1 kg, SpO2 94%.        Intake/Output Summary (Last 24 hours) at 12/06/2023 0720 Last data filed at 12/06/2023 0700 Gross per 24 hour  Intake 2804.16 ml  Output 750 ml  Net 2054.16 ml   Filed Weights   12/04/23 1640 12/04/23 2152  Weight: 40 kg 49.1 kg    Examination: General:  pleasant Hispanic speaking older female lying in bed in NAD HEENT: MM pink/moist, pupils 4/r Neuro: alert, oriented x 3, MAE- non focal CV: rr, NSR PULM:  non labored, clear GI: soft, bs+, NT Extremities: warm/dry, no LE edema  Skin: no rashes  Afebrile Voiding> + unmeasured UOP Labs> K 2.9, glucose 142, sCr 0.57, Hgb 11.8 CBG overnight  max 456> rechecked at 236, down to 66 overnight, 189 this am  Resolved problem list   Assessment and Plan  SAH, non aneurysmal bleed CT of head revealed a subarachnoid hemorrhage centered in the right sylvian fissure and basilar cisterns, greatest on the right but no intraparenchymal hemorrhage, hydrocephalus,  or acute infarct.  P: - per NSGY, defer further imaging to NSGY, likely repeat angio after 11/6 (5 days from admit) or if any acute neuro changes - SBP goal 90-140 - cont nimodipine - add empiric keppra for seizure ppx/ seizure precautions - repeat CTH today - TCDs - serial neuro exams -maintain neuro protective measures; goal for eurothermia, euglycemia, eunatermia, normoxia, and PCO2 goal of 35-40 - SCDs    HTN P: - remains off Cleviprex, normotensive within goal SBP 90-140 per NSGY - prn labetalol   HLD  P:  - restart statin  Uncontrolled DM Hyperglycemia- CBG > 300  Suspect patient has diabetic retinopathy from uncontrolled DM  No evidence of DKA/HHNK  Last hgb A1c >15 in 6/22  On insulin  70/30-14 units BID daily with meal, and glimepiride  once daily  P: - cont prn SSI, CBG q 4 and prn  - decrease Lantus  10> 5 units given glucose 66 overnight  UTI - cont ceftriaxone for 3 days - follow urine cx - afebrile, WBC remains normal  Hypokalemia - KCL replete - check mag  Medication Assistance  Patient not taking any medications at home - supposed to be on insulin  70/30, glimepiride , aspirin , lipitor  P: - TOC and Diabetes coordinator consulted  Critical care time:    CRITICAL CARE Performed by: Lyle Pesa   Total critical care time: 37 minutes  Critical care time was exclusive of separately billable procedures and treating other patients.  Critical care was necessary to treat or prevent imminent or life-threatening deterioration.  Critical care was time spent personally by me on the following activities: development of treatment plan with patient and/or surrogate as well as nursing, discussions with consultants, evaluation of patient's response to treatment, examination of patient, obtaining history from patient or surrogate, ordering and performing treatments and interventions, ordering and review of laboratory studies, ordering and review of radiographic studies, pulse oximetry and re-evaluation of patient's condition.    Lyle Pesa, NP [facility] 12/06/2023, 7:20 AM  See Amion for pager If no response to pager , please call [phone] until 7pm After 7:00 pm call Elink  [phone]

## 2023-12-07 LAB — URINE CULTURE: Culture: >=100000 — AB

## 2023-12-07 LAB — CBC
HCT: 35.1 % — ABNORMAL LOW (ref 36.0–46.0)
Hemoglobin: 11.9 g/dL — ABNORMAL LOW (ref 12.0–15.0)
MCH: 27.4 pg (ref 26.0–34.0)
MCHC: 33.9 g/dL (ref 30.0–36.0)
MCV: 80.7 fL (ref 80.0–100.0)
Platelets: 192 K/uL (ref 150–400)
RBC: 4.35 MIL/uL (ref 3.87–5.11)
RDW: 12.5 % (ref 11.5–15.5)
WBC: 7.3 K/uL (ref 4.0–10.5)
nRBC: 0 % (ref 0.0–0.2)

## 2023-12-07 LAB — BASIC METABOLIC PANEL WITH GFR
Anion gap: 10 (ref 5–15)
BUN: 6 mg/dL (ref 6–20)
CO2: 23 mmol/L (ref 22–32)
Calcium: 8.6 mg/dL — ABNORMAL LOW (ref 8.9–10.3)
Chloride: 107 mmol/L (ref 98–111)
Creatinine, Ser: 0.5 mg/dL (ref 0.44–1.00)
GFR, Estimated: >60 mL/min (ref >60)
Glucose, Bld: 199 mg/dL — ABNORMAL HIGH (ref 70–99)
Potassium: 3.8 mmol/L (ref 3.5–5.1)
Sodium: 140 mmol/L (ref 135–145)

## 2023-12-07 LAB — GLUCOSE, CAPILLARY
Glucose-Capillary: 222 mg/dL — ABNORMAL HIGH (ref 70–99)
Glucose-Capillary: 155 mg/dL — ABNORMAL HIGH (ref 70–99)
Glucose-Capillary: 302 mg/dL — ABNORMAL HIGH (ref 70–99)
Glucose-Capillary: 96 mg/dL (ref 70–99)
Glucose-Capillary: 280 mg/dL — ABNORMAL HIGH (ref 70–99)
Glucose-Capillary: 252 mg/dL — ABNORMAL HIGH (ref 70–99)

## 2023-12-07 LAB — MAGNESIUM: Magnesium: 2.2 mg/dL (ref 1.7–2.4)

## 2023-12-07 MED ORDER — ALUM & MAG HYDROXIDE-SIMETH 200-200-20 MG/5ML PO SUSP
30.0000 mL | Freq: Four times a day (QID) | ORAL | Status: DC | PRN
  Administered 2023-12-08: 30 mL via ORAL
  Filled 2023-12-07: qty 30

## 2023-12-07 MED ORDER — BUTALBITAL-APAP-CAFFEINE 50-325-40 MG PO TABS
1.0000 | ORAL_TABLET | Freq: Four times a day (QID) | ORAL | Status: DC | PRN
  Administered 2023-12-07 – 2023-12-11 (×6): 1 via ORAL
  Filled 2023-12-07 (×7): qty 1

## 2023-12-07 MED ORDER — INSULIN ASPART 100 UNIT/ML IJ SOLN
0.0000 [IU] | Freq: Every day | INTRAMUSCULAR | Status: DC
  Administered 2023-12-07 – 2023-12-08 (×2): 3 [IU] via SUBCUTANEOUS
  Filled 2023-12-07 (×2): qty 3

## 2023-12-07 MED ORDER — ONDANSETRON HCL 4 MG/2ML IJ SOLN
4.0000 mg | Freq: Three times a day (TID) | INTRAMUSCULAR | Status: DC | PRN
  Administered 2023-12-07 – 2023-12-11 (×3): 4 mg via INTRAVENOUS
  Filled 2023-12-07 (×4): qty 2

## 2023-12-07 MED ORDER — LIVING WELL WITH DIABETES BOOK - IN SPANISH
Freq: Once | Status: AC
  Filled 2023-12-07: qty 1

## 2023-12-07 MED ORDER — ONDANSETRON HCL 4 MG/2ML IJ SOLN
INTRAMUSCULAR | Status: AC
  Administered 2023-12-07: 4 mg via INTRAVENOUS
  Filled 2023-12-07: qty 2

## 2023-12-07 MED ORDER — INSULIN ASPART 100 UNIT/ML IJ SOLN
0.0000 [IU] | Freq: Three times a day (TID) | INTRAMUSCULAR | Status: DC
  Administered 2023-12-08: 3 [IU] via SUBCUTANEOUS
  Administered 2023-12-08: 5 [IU] via SUBCUTANEOUS
  Administered 2023-12-08: 8 [IU] via SUBCUTANEOUS
  Administered 2023-12-09: 11 [IU] via SUBCUTANEOUS
  Filled 2023-12-07: qty 5
  Filled 2023-12-07: qty 8
  Filled 2023-12-07: qty 3
  Filled 2023-12-07: qty 11

## 2023-12-07 MED ORDER — INSULIN GLARGINE-YFGN 100 UNIT/ML ~~LOC~~ SOLN
8.0000 [IU] | Freq: Two times a day (BID) | SUBCUTANEOUS | Status: DC
  Administered 2023-12-07: 8 [IU] via SUBCUTANEOUS
  Filled 2023-12-07 (×2): qty 0.08

## 2023-12-07 MED ORDER — INSULIN GLARGINE-YFGN 100 UNIT/ML ~~LOC~~ SOLN
5.0000 [IU] | Freq: Two times a day (BID) | SUBCUTANEOUS | Status: DC
  Administered 2023-12-07 – 2023-12-09 (×4): 5 [IU] via SUBCUTANEOUS
  Filled 2023-12-07 (×5): qty 0.05

## 2023-12-07 NOTE — Plan of Care (Signed)
  Problem: Education: Goal: Knowledge of General Education information will improve Description: Including pain rating scale, medication(s)/side effects and non-pharmacologic comfort measures Outcome: Progressing   Problem: Health Behavior/Discharge Planning: Goal: Ability to manage health-related needs will improve Outcome: Progressing   Problem: Clinical Measurements: Goal: Ability to maintain clinical measurements within normal limits will improve Outcome: Progressing Goal: Will remain free from infection Outcome: Progressing Goal: Diagnostic test results will improve Outcome: Progressing Goal: Respiratory complications will improve Outcome: Progressing Goal: Cardiovascular complication will be avoided Outcome: Progressing   Problem: Activity: Goal: Risk for activity intolerance will decrease Outcome: Progressing   Problem: Nutrition: Goal: Adequate nutrition will be maintained Outcome: Progressing   Problem: Coping: Goal: Level of anxiety will decrease Outcome: Progressing   Problem: Elimination: Goal: Will not experience complications related to bowel motility Outcome: Progressing Goal: Will not experience complications related to urinary retention Outcome: Progressing   Problem: Pain Managment: Goal: General experience of comfort will improve and/or be controlled Outcome: Progressing   Problem: Safety: Goal: Ability to remain free from injury will improve Outcome: Progressing   Problem: Skin Integrity: Goal: Risk for impaired skin integrity will decrease Outcome: Progressing   Problem: Education: Goal: Knowledge of disease or condition will improve Outcome: Progressing Goal: Knowledge of secondary prevention will improve (MUST DOCUMENT ALL) Outcome: Progressing Goal: Knowledge of patient specific risk factors will improve (DELETE if not current risk factor) Outcome: Progressing   Problem: Spontaneous Subarachnoid Hemorrhage Tissue Perfusion: Goal:  Complications of Spontaneous Subarachnoid Hemorrhage will be minimized Outcome: Progressing   Problem: Coping: Goal: Will verbalize positive feelings about self Outcome: Progressing Goal: Will identify appropriate support needs Outcome: Progressing   Problem: Health Behavior/Discharge Planning: Goal: Ability to manage health-related needs will improve Outcome: Progressing Goal: Goals will be collaboratively established with patient/family Outcome: Progressing   Problem: Self-Care: Goal: Ability to participate in self-care as condition permits will improve Outcome: Progressing Goal: Verbalization of feelings and concerns over difficulty with self-care will improve Outcome: Progressing Goal: Ability to communicate needs accurately will improve Outcome: Progressing   Problem: Nutrition: Goal: Risk of aspiration will decrease Outcome: Progressing Goal: Dietary intake will improve Outcome: Progressing   Problem: Education: Goal: Ability to describe self-care measures that may prevent or decrease complications (Diabetes Survival Skills Education) will improve Outcome: Progressing Goal: Individualized Educational Video(s) Outcome: Progressing   Problem: Coping: Goal: Ability to adjust to condition or change in health will improve Outcome: Progressing   Problem: Fluid Volume: Goal: Ability to maintain a balanced intake and output will improve Outcome: Progressing   Problem: Health Behavior/Discharge Planning: Goal: Ability to identify and utilize available resources and services will improve Outcome: Progressing Goal: Ability to manage health-related needs will improve Outcome: Progressing   Problem: Metabolic: Goal: Ability to maintain appropriate glucose levels will improve Outcome: Progressing   Problem: Nutritional: Goal: Maintenance of adequate nutrition will improve Outcome: Progressing Goal: Progress toward achieving an optimal weight will improve Outcome:  Progressing   Problem: Skin Integrity: Goal: Risk for impaired skin integrity will decrease Outcome: Progressing   Problem: Tissue Perfusion: Goal: Adequacy of tissue perfusion will improve Outcome: Progressing

## 2023-12-07 NOTE — Progress Notes (Signed)
 1530: Pt reporting nausea, dizziness, headache extending to neck, chest pain, and anxiety. EKG performed, vitals checked and VSS. NIHSS remains 0. Neurologically unchanged. Zofran  administered for nausea and Fioricet administered for headache. Brooke CCM notified of findings. Blood glucose obtained, in 90s. Brooke came to bedside and spoke with patient and patient's son/daughter.   1600: Patient reporting pain is improved, with chest tenderness only when she presses on her upper chest. Headache improved and nausea resolved. Patient encouraged to report worsening pain early to prevent breakthrough pain events.

## 2023-12-07 NOTE — Progress Notes (Signed)
 Patient ID: Kimberly Fry, female   DOB: 14-Jul-1965, 58 y.o.   MRN: [id number] BP (!) 151/80 (BP Location: Left Arm)   Pulse 65   Temp 98.3 F (36.8 C) (Oral)   Resp (!) 22   Ht 5' 4 (1.626 m)   Wt 49.1 kg   SpO2 97%   BMI 18.58 kg/m  Alert and oriented x 4, speech is clear and fluent Perrl, full eom Symmetric smile, symmetric facial sensation Tongue and uvula midline Normal shoulder shrug.  There actually is an argument to be made that she has now had two negative angios for aneurysm. While conventional doctrine holds that a repeat angiogram should be performed with a non aneurysmal SAH, one can argue strongly that she has already satisfied this threshold.  I have discussed this with Dr. Lanis and given the risks of a catheter angio the benefit to the patient is questionable.

## 2023-12-07 NOTE — Inpatient Diabetes Management (Signed)
 Inpatient Diabetes Program Recommendations  AACE/ADA: New Consensus Statement on Inpatient Glycemic Control (2015)  Target Ranges:  Prepandial:   less than 140 mg/dL      Peak postprandial:   less than 180 mg/dL (1-2 hours)      Critically ill patients:  140 - 180 mg/dL   Lab Results  Component Value Date   GLUCAP 302 (H) 12/07/2023   HGBA1C 9.5 (H) 12/05/2023    Latest Reference Range & Units 12/06/23 07:47 12/06/23 11:29 12/06/23 15:35 12/06/23 19:20 12/06/23 23:04 12/07/23 03:47 12/07/23 08:06 12/07/23 11:36  Glucose-Capillary 70 - 99 mg/dL 810 (H) 604 (H) 889 (H) 155 (H) 212 (H) 222 (H) 155 (H) 302 (H)  (H): Data is abnormally high  Diabetes history: DM2 Outpatient Diabetes medications: 70/30 14 bid (patient states she has only been taking ac dinner) Current orders for Inpatient glycemic control: Lantus  8 units bid, Novolog  0-20 units q 4 hrs.  Inpatient Diabetes Program Recommendations:   Please consider: -Decrease Novolog  correction to 0-15 units q 4 hrs. -Add Novolog  5 units tid meal coverage if eats 50% meal  Spoke with patient regarding diabetes management. Patient states @ home she has not been taking her morning insulin  if her CBG 150 or less. Explained to patient need to adjust her insulin  to be accurate for her to take ac breakfast and dinner. Reviewed A1c 9.5 (average blood glucose 226 over the past 2-3 months). Discussed with RN need to review discharge insulin  recommendations for insulin .  Thank you, Lailanie Hasley E. Annise Boran, RN, MSN, CNS, CDCES  Diabetes Coordinator Inpatient Glycemic Control Team Team Pager [phone] (8am-5pm) 12/07/2023 1:07 PM

## 2023-12-07 NOTE — Progress Notes (Signed)
 NAME:  Kimberly Fry, MRN:  [id number], DOB:  06-21-1965, LOS: 3 ADMISSION DATE:  12/04/2023, CONSULTATION DATE:  12/04/23 REFERRING MD: Prentice Medicus MD CHIEF COMPLAINT:  Chest Pain/Abdominal pain   History of Present Illness:  Pt is a 89 yr spanish speaking female with significant pmhx of diabetes, HTN, and HLD presenting to [facility] ED for complaints of chest pain/abdominal pain which began earlier this morning, she had been her usual state of health the day before. Patient reports that she has had an ongoing headache with intermittent vomiting. Upon initial work up in the ED, CT of head revealed a subarachnoid hemorrhage centered in the right sylvian fissure and basilar cisterns, greatest on the right but no intraparenchymal hemorrhage, hydrocephalus, or acute infarct. CT Angio Chest/abdomen/pelvis obtained to rule out aortic dissection which was negative for but did reveal large amount of stool burden in colon.   Upon arrival to [facility] Neuro ICU, patient off cleviprex. SBP 110s-130s. Utilized translator to assist with obtaining history. Patient stated that she hit her head on a piece of furniture about 8 days ago. Patient stated she did not fall but has issues with her vision due to her diabetes. She endorses that she can only see shadows nothing more. Per patient, her eye sight has been worsening over the last 1 yr. She is supposed to be on insulin  70/30 at home, along with glimepiride  but patient states she was only given these medications in the hospital. Patient mentions she lives with daughter at home and that is who brought her to the hospital. Prior to yesterday 11/1, patient was in her usual state of health. Patient confirms that headache and neck pain were a 10/10 but now a 6. Pain has improved.   Pertinent  Medical History   Past Medical History:  Diagnosis Date   Diabetes mellitus without complication (HCC)   HTN HLD   Significant Hospital Events: Including procedures,  antibiotic start and stop dates in addition to other pertinent events   12/04/23 PCCM Admit, patient with [facility] 11/2 UTI+, ceftriaxone  Interim History / Subjective:  C/o of ongoing frontal headache and eye pain and new sensitivity to light, some nausea last evening Confused to date/ time otherwise no neuro changes  Objective    Blood pressure 105/65, pulse 65, temperature 98.1 F (36.7 C), temperature source Oral, resp. rate 17, height 5' 4 (1.626 m), weight 49.1 kg, SpO2 97%.        Intake/Output Summary (Last 24 hours) at 12/07/2023 0849 Last data filed at 12/06/2023 2100 Gross per 24 hour  Intake 340 ml  Output --  Net 340 ml   Filed Weights   12/04/23 1640 12/04/23 2152  Weight: 40 kg 49.1 kg   Examination: Video interpreter utilized  General:  pleasant older adult female lying in bed in NAD HEENT: MM pink/moist, pupils 3/r Neuro: Awake, oriented to person, place, MAE, no drift, speech clear CV: rr, NSR, no murmur PULM:  non labored, CTA GI: soft, bs+, ND Extremities: warm/dry, no LE edema  Skin: no rashes    Afebrile Voiding> 3 unmeasured UOP Labs> K 3.8, glucose 199, sCr 0.50, Mag 2.2, Hgb 11.9, WBC 7.3 CBG's 110 ranging to 222  CTH 11/3>  Resolved problem list   Assessment and Plan  SAH CT of head revealed a subarachnoid hemorrhage centered in the right sylvian fissure and basilar cisterns, greatest on the right but no intraparenchymal hemorrhage, hydrocephalus, or acute infarct. CTA neg for aneurysm, repeat CTH  11/3 mildly decreased volume of SAH in similar distribution, unchanged ventricular size P: - per NSGY, defer further imaging to NSGY, likely repeat angio after 11/6 (5 days from admit) or if any acute neuro changes - SBP goal 90-140 - cont nimodipine - keppra for seizure ppx/ seizure precautions -7 days - TCDs - serial neuro exams -maintain neuro protective measures; goal for eurothermia, euglycemia, eunatermia, normoxia - SCDs  - cont tylenol  for  pain, fioricet for severe pain, if ongoing pain consider decadron  HTN P: - bp goals as above - prn labetalol   HLD  P:  - statin  Uncontrolled DM with hyperglycemia Suspect patient has diabetic retinopathy from uncontrolled DM  Last hgb A1c >15 in 6/22, repeat A1c 9.5 this admit P: - cont prn rSSI, CBG q 4 - increase lantus  8u BID  UTI - day 2/ 3x ceftriaxone - UC with > 100k E. Coli  - afebrile, WBC remains normal  Hypokalemia - trend on labs, replete prn   Medication Assistance  Patient not taking any medications at home - supposed to be on insulin  70/30, glimepiride , aspirin , lipitor  P: - TOC and Diabetes coordinator consulted  Critical care time:    CRITICAL CARE Performed by: Lyle Pesa   Total critical care time: 38 minutes  Critical care time was exclusive of separately billable procedures and treating other patients.  Critical care was necessary to treat or prevent imminent or life-threatening deterioration.  Critical care was time spent personally by me on the following activities: development of treatment plan with patient and/or surrogate as well as nursing, discussions with consultants, evaluation of patient's response to treatment, examination of patient, obtaining history from patient or surrogate, ordering and performing treatments and interventions, ordering and review of laboratory studies, ordering and review of radiographic studies, pulse oximetry and re-evaluation of patient's condition.    Lyle Pesa, NP [facility] 12/07/2023, 8:49 AM  See Amion for pager If no response to pager , please call [phone] until 7pm After 7:00 pm call Elink  [phone]

## 2023-12-08 ENCOUNTER — Inpatient Hospital Stay (HOSPITAL_COMMUNITY)

## 2023-12-08 DIAGNOSIS — I609 Nontraumatic subarachnoid hemorrhage, unspecified: Secondary | ICD-10-CM

## 2023-12-08 LAB — CBC
HCT: 35.9 % — ABNORMAL LOW (ref 36.0–46.0)
Hemoglobin: 12.1 g/dL (ref 12.0–15.0)
MCH: 27.3 pg (ref 26.0–34.0)
MCHC: 33.7 g/dL (ref 30.0–36.0)
MCV: 81 fL (ref 80.0–100.0)
Platelets: 211 K/uL (ref 150–400)
RBC: 4.43 MIL/uL (ref 3.87–5.11)
RDW: 12.5 % (ref 11.5–15.5)
WBC: 9.1 K/uL (ref 4.0–10.5)
nRBC: 0 % (ref 0.0–0.2)

## 2023-12-08 LAB — BASIC METABOLIC PANEL WITH GFR
Anion gap: 12 (ref 5–15)
BUN: 7 mg/dL (ref 6–20)
CO2: 23 mmol/L (ref 22–32)
Calcium: 8.7 mg/dL — ABNORMAL LOW (ref 8.9–10.3)
Chloride: 102 mmol/L (ref 98–111)
Creatinine, Ser: 0.63 mg/dL (ref 0.44–1.00)
GFR, Estimated: >60 mL/min (ref >60)
Glucose, Bld: 261 mg/dL — ABNORMAL HIGH (ref 70–99)
Potassium: 3.8 mmol/L (ref 3.5–5.1)
Sodium: 137 mmol/L (ref 135–145)

## 2023-12-08 LAB — GLUCOSE, CAPILLARY
Glucose-Capillary: 254 mg/dL — ABNORMAL HIGH (ref 70–99)
Glucose-Capillary: 184 mg/dL — ABNORMAL HIGH (ref 70–99)
Glucose-Capillary: 222 mg/dL — ABNORMAL HIGH (ref 70–99)
Glucose-Capillary: 284 mg/dL — ABNORMAL HIGH (ref 70–99)

## 2023-12-08 MED ORDER — JUVEN PO PACK
1.0000 | PACK | Freq: Two times a day (BID) | ORAL | Status: DC
  Administered 2023-12-09 – 2023-12-11 (×5): 1 via ORAL
  Filled 2023-12-08 (×5): qty 1

## 2023-12-08 MED ORDER — INSULIN ASPART 100 UNIT/ML IJ SOLN
4.0000 [IU] | Freq: Three times a day (TID) | INTRAMUSCULAR | Status: DC
  Administered 2023-12-09 – 2023-12-11 (×6): 4 [IU] via SUBCUTANEOUS
  Filled 2023-12-08 (×6): qty 4

## 2023-12-08 MED ORDER — SIMETHICONE 80 MG PO CHEW
80.0000 mg | CHEWABLE_TABLET | Freq: Four times a day (QID) | ORAL | Status: DC | PRN
Start: 2023-12-08 — End: 2023-12-11

## 2023-12-08 MED ORDER — POLYETHYLENE GLYCOL 3350 17 G PO PACK
17.0000 g | PACK | Freq: Every day | ORAL | Status: DC
  Administered 2023-12-08 – 2023-12-09 (×2): 17 g via ORAL
  Filled 2023-12-08 (×2): qty 1

## 2023-12-08 MED ORDER — THIAMINE MONONITRATE 100 MG PO TABS
100.0000 mg | ORAL_TABLET | Freq: Every day | ORAL | Status: DC
  Administered 2023-12-08 – 2023-12-11 (×4): 100 mg via ORAL
  Filled 2023-12-08 (×4): qty 1

## 2023-12-08 MED ORDER — METOCLOPRAMIDE HCL 10 MG PO TABS
10.0000 mg | ORAL_TABLET | Freq: Three times a day (TID) | ORAL | Status: DC
  Administered 2023-12-08 – 2023-12-11 (×10): 10 mg via ORAL
  Filled 2023-12-08 (×11): qty 1

## 2023-12-08 MED ORDER — GLUCERNA SHAKE PO LIQD
237.0000 mL | Freq: Two times a day (BID) | ORAL | Status: DC
  Administered 2023-12-09 – 2023-12-11 (×5): 237 mL via ORAL

## 2023-12-08 MED ORDER — DEXAMETHASONE SODIUM PHOSPHATE 4 MG/ML IJ SOLN
4.0000 mg | Freq: Two times a day (BID) | INTRAMUSCULAR | Status: DC
  Administered 2023-12-08 – 2023-12-10 (×5): 4 mg via INTRAVENOUS
  Filled 2023-12-08 (×5): qty 1

## 2023-12-08 MED ORDER — BISACODYL 10 MG RE SUPP: 10.0000 mg | Freq: Every day | RECTAL | Status: DC | PRN

## 2023-12-08 MED ORDER — SENNOSIDES-DOCUSATE SODIUM 8.6-50 MG PO TABS: 1.0000 | ORAL_TABLET | Freq: Every evening | ORAL | Status: DC | PRN

## 2023-12-08 MED ORDER — ACETAMINOPHEN 325 MG PO TABS
650.0000 mg | ORAL_TABLET | Freq: Four times a day (QID) | ORAL | Status: DC
  Administered 2023-12-08 – 2023-12-11 (×11): 650 mg via ORAL
  Filled 2023-12-08 (×11): qty 2

## 2023-12-08 MED ORDER — ADULT MULTIVITAMIN W/MINERALS CH
1.0000 | ORAL_TABLET | Freq: Every day | ORAL | Status: DC
  Administered 2023-12-08 – 2023-12-11 (×4): 1 via ORAL
  Filled 2023-12-08 (×4): qty 1

## 2023-12-08 NOTE — Evaluation (Signed)
 Speech Language Pathology Evaluation Patient Details Name: Javonne Louissaint MRN: [id number] DOB: [birth year] Today's Date: 12/08/2023 Time: 1050-1120 SLP Time Calculation (min) (ACUTE ONLY): 30 min  Problem List:  Patient Active Problem List   Diagnosis Date Noted   SAH (subarachnoid hemorrhage) (HCC) 12/05/2023   Subarachnoid hemorrhage (HCC) 12/04/2023   Prolonged QT interval 07/25/2020   Chest pain 07/24/2020   Syncope 07/24/2020   Type 2 diabetes mellitus with hyperglycemia, without long-term current use of insulin  (HCC) 07/24/2020   Elevated troponin 07/24/2020   Past Medical History:  Past Medical History:  Diagnosis Date   Diabetes mellitus without complication (HCC)    Past Surgical History:  Past Surgical History:  Procedure Laterality Date   KNEE SURGERY     HPI:  58 yo female presents to ED 11/1 with x1 day headache and nausea/vomiting. CTH shows SAH centered in R sylvian fissure and basilar cisterns R>L. PMH includes  diabetes, hypertension, and hyperlipidemia.   Assessment / Plan / Recommendation Clinical Impression  Pt demonstrates no cognitive linguistic impairment. Pt assessed by spanish speaking SLP. Able to comlete simple functional tasks appropriately. Pt does have episodes of severe pain and prefers to stay asleep when possible. No need for SLP f/u.    SLP Assessment  SLP Recommendation/Assessment: Patient does not need any further Speech Language Pathology Services     Assistance Recommended at Discharge     Functional Status Assessment    Frequency and Duration           SLP Evaluation Cognition  Overall Cognitive Status: Within Functional Limits for tasks assessed Orientation Level: Oriented X4       Comprehension  Auditory Comprehension Overall Auditory Comprehension: Appears within functional limits for tasks assessed    Expression Verbal Expression Overall Verbal Expression: Appears within functional limits for tasks assessed    Oral / Motor  Oral Motor/Sensory Function Overall Oral Motor/Sensory Function: Within functional limits Motor Speech Overall Motor Speech: Appears within functional limits for tasks assessed            Kristin Barcus, Consuelo Fitch 12/08/2023, 11:39 AM

## 2023-12-08 NOTE — Treatment Plan (Signed)
 Occupational Therapy Treatment Patient Details Name: Kimberly Fry MRN: [id number] DOB: 26-Jul-1965 Today's Date: 12/08/2023   History of present illness 58 yo female presents to ED 11/1 with x1 day headache and nausea/vomiting. CTH shows SAH centered in R sylvian fissure and basilar cisterns R>L. PMH includes  diabetes, hypertension, and hyperlipidemia.   OT comments  Pt fatigued but agreeable to adl task and balance challenges. Pt unsteady with LOB during session with MIN (A) to correct. Pt reports dizziness during session with stable BP. Recommendation for d/c home with family (A).   Video interpreter Elenore [phone]      If plan is discharge home, recommend the following:  A little help with walking and/or transfers;A little help with bathing/dressing/bathroom;Assistance with cooking/housework;Assist for transportation   Equipment Recommendations  None recommended by OT    Recommendations for Other Services      Precautions / Restrictions Precautions Precautions: Fall       Mobility Bed Mobility Overal bed mobility: Needs Assistance Bed Mobility: Supine to Sit, Sit to Supine     Supine to sit: Min assist Sit to supine: Supervision   General bed mobility comments: exiting on R side. increased time to progress to eob. pt pausing due to dizziness    Transfers Overall transfer level: Needs assistance   Transfers: Sit to/from Stand Sit to Stand: Contact guard assist           General transfer comment: needs min (A) with transfer due to unsteady     Balance Overall balance assessment: Needs assistance   Sitting balance-Leahy Scale: Fair       Standing balance-Leahy Scale: Poor                             ADL either performed or assessed with clinical judgement   ADL Overall ADL's : Needs assistance/impaired     Grooming: Wash/dry hands;Minimal assistance;Standing                   Toilet Transfer: Minimal  assistance;Ambulation;Regular Toilet   Toileting- Clothing Manipulation and Hygiene: Contact guard assist;Sitting/lateral lean       Functional mobility during ADLs: Minimal assistance General ADL Comments: pt reports dizziness but only a little. BP stable. pt demonstrates LOB posterior x3 during session. pt extremely fatigued and at end of session says good night to therapist to indicate being done    [facility] Assessment              Vision       Perception     Praxis     Communication Communication Communication: Impaired Factors Affecting Communication: Reduced clarity of speech;Non - English speaking, interpreter not available (soft speaking and interpreter asking to repeat several times)   Cognition Arousal: Alert Behavior During Therapy: Flat affect Cognition: No family/caregiver present to determine baseline             OT - Cognition Comments: overall WFL, difficult to fully assess due to language barrier                 Following commands: Intact        Cueing   Cueing Techniques: Verbal cues  Exercises      Shoulder Instructions       General Comments RA VSS reports mild dizziness    Pertinent Vitals/ Pain       Pain Assessment Pain Assessment: No/denies pain Pain Intervention(s):  (reports VERY tired)  Home Living     Available Help at Discharge: Family;Available 24 hours/day Type of Home: House                              Lives With: Family    Prior Functioning/Environment              Frequency  Min 2X/week        Progress Toward Goals  OT Goals(current goals can now be found in the care plan section)  Progress towards OT goals: Progressing toward goals  Acute Rehab OT Goals Patient Stated Goal: to sleep OT Goal Formulation: With patient Time For Goal Achievement: 12/20/23 Potential to Achieve Goals: Good ADL Goals Additional ADL Goal #1: Pt will complete all ADLs independently  Plan       Co-evaluation                 AM-PAC OT 6 Clicks Daily Activity     Outcome Measure   Help from another person eating meals?: None Help from another person taking care of personal grooming?: A Little Help from another person toileting, which includes using toliet, bedpan, or urinal?: A Little Help from another person bathing (including washing, rinsing, drying)?: A Little Help from another person to put on and taking off regular upper body clothing?: A Little Help from another person to put on and taking off regular lower body clothing?: A Little 6 Click Score: 19    End of Session Equipment Utilized During Treatment: Gait belt  OT Visit Diagnosis: Unsteadiness on feet (R26.81);Other abnormalities of gait and mobility (R26.89);Muscle weakness (generalized) (M62.81)   Activity Tolerance Patient limited by fatigue   Patient Left in bed;with call bell/phone within reach;with bed alarm set   Nurse Communication Mobility status;Precautions        Time: 8799-8784 OT Time Calculation (min): 15 min  Charges: OT General Charges $OT Visit: 1 Visit OT Treatments $Self Care/Home Management : 8-22 mins   Brynn, OTR/L  Acute Rehabilitation Services Office: [phone] .   Kimberly Fry 12/08/2023, 1:53 PM

## 2023-12-08 NOTE — Progress Notes (Addendum)
 Initial Nutrition Assessment  DOCUMENTATION CODES:   Non-severe (moderate) malnutrition in context of chronic illness  INTERVENTION:  Glucerna BID. Each supplement provides 220 Kcals and 10 grams of protein. Juven BID PO for wound healing. The patient is at risk for refeeding syndrome. Add Thiamine 100 mg PO daily for 7 days. Add Multivitamin PO daily. Monitor magnesium , potassium, and phosphorus daily for at least 3 days, with replacement per protocol. Ordered labs. Continue carb-modified diet. The patient reports possibly requiring soft/pureed solids due to lack of dentition.    NUTRITION DIAGNOSIS:   Moderate Malnutrition related to chronic illness as evidenced by severe fat depletion, moderate muscle depletion.   GOAL:   Patient will meet greater than or equal to 90% of their needs   MONITOR:   PO intake, Supplement acceptance, Labs, Weight trends  REASON FOR ASSESSMENT:   Consult Assessment of nutrition requirement/status, Diet education  ASSESSMENT:   Patient presented with chest/abdominal pain, headache and intermittent emesis s/p hitting her head on furniture a week prior and was found to have subarachnoid hemorrhage, UTI, as well as large stool burden in colon. Her PMH is significant for DM2 with worsening vision (can only see shadows) and medication non-compliance, HTN and dyslipidemia.  Visited the patient with RN at bedside. Tablet translator utilized. The patient states she was eating well up until about 3 days prior to admission when she did not have an appetite and would only take bites of food. RN reports the patient is currently struggling with nausea and has poor PO intake. The patient is unaware of her UBW but states she has gained weight recently. She tells me she had surgery after her accident in 2021 whereby her bottom teeth were removed. She mostly eats pureed foods. Her children monitor her carbohydrate intake and will not allow her to have sugary  drinks. She also carbohydrate counts and denies requiring additional diet education. She tells me she remains active and helps take care of her grand children despite having knee surgery a few years ago. She is amenable to Glucerna and Juven BID.  Typical daily intake: Breakfast - scrambled eggs or fried plantains with sour cream, or tortilla with cheese. Lunch - tortillas with rice and bean, chicken or seafood with potatoes. Dinner - same as lunch.   Scheduled Meds:  acetaminophen   650 mg Oral Q6H   atorvastatin   40 mg Oral Daily   Chlorhexidine Gluconate Cloth  6 each Topical Daily   dexamethasone (DECADRON) injection  4 mg Intravenous Q12H   insulin  aspart  0-15 Units Subcutaneous TID WC   insulin  aspart  0-5 Units Subcutaneous QHS   insulin  aspart  4 Units Subcutaneous TID WC   insulin  glargine-yfgn  5 Units Subcutaneous BID   levETIRAcetam  500 mg Oral BID   metoCLOPramide  10 mg Oral TID AC   niMODipine  60 mg Oral Q4H   pantoprazole  40 mg Oral Daily   Or   pantoprazole (PROTONIX) IV  40 mg Intravenous Daily   polyethylene glycol  17 g Oral Daily  20+40 mEq potassium chloride  last given 11/3 1 g magnesium  sulfate last given 11/3    Diet Order             Diet Carb Modified Fluid consistency: Thin; Room service appropriate? Yes  Diet effective now                  Meal Intake: 25% (50-100% charted)  Labs:     Latest Ref  Rng & Units 12/08/2023    2:30 AM 12/07/2023    3:27 AM 12/06/2023    5:54 AM  CMP  Glucose 70 - 99 mg/dL 738  800  857   BUN 6 - 20 mg/dL 7  6  7    Creatinine 0.44 - 1.00 mg/dL 9.36  9.49  9.42   Sodium 135 - 145 mmol/L 137  140  142   Potassium 3.5 - 5.1 mmol/L 3.8  3.8  2.9   Chloride 98 - 111 mmol/L 102  107  108   CO2 22 - 32 mmol/L 23  23  22    Calcium  8.9 - 10.3 mg/dL 8.7  8.6  8.0      I/O: +3.5 L since admit  NUTRITION - FOCUSED PHYSICAL EXAM:  Flowsheet Row Most Recent Value  Orbital Region Severe depletion  Upper Arm  Region Mild depletion  Thoracic and Lumbar Region Mild depletion  Buccal Region Severe depletion  Temple Region Severe depletion  Clavicle Bone Region Moderate depletion  Clavicle and Acromion Bone Region Moderate depletion  Scapular Bone Region Mild depletion  Dorsal Hand Mild depletion  Patellar Region No depletion  Anterior Thigh Region Mild depletion  Posterior Calf Region Mild depletion  Edema (RD Assessment) None  Hair Reviewed  Eyes Reviewed  Mouth --  [lack of bottom teeth]  Skin Reviewed  Nails Reviewed    EDUCATION NEEDS:   Education needs have been addressed  Skin:  Skin Assessment: Skin Integrity Issues: Skin Integrity Issues:: Stage II Stage II: coccyx  Last BM:  11/2 type 2  Height:   Ht Readings from Last 1 Encounters:  12/04/23 5' 4 (1.626 m)    Weight:   Wt Readings from Last 1 Encounters:  12/04/23 49.1 kg    Ideal Body Weight:  55 kg  BMI:  Body mass index is 18.58 kg/m.  Estimated Nutritional Needs:  Kcal:  1600-1900 Protein:  90-120 Fluid:  Fluid needs per ICU management    Leverne Ruth, MS, RDN, LDN [place] See AMION for contact information

## 2023-12-08 NOTE — Progress Notes (Signed)
 Physical Therapy Treatment Patient Details Name: Kimberly Fry MRN: [id number] DOB: 08-19-1965 Today's Date: 12/08/2023   History of Present Illness 58 yo female presents to ED 11/1 with x1 day headache and nausea/vomiting. CTH shows SAH centered in R sylvian fissure and basilar cisterns R>L. PMH includes  diabetes, hypertension, and hyperlipidemia.    PT Comments  Pt is limited by reports of stomach ache and then with vomiting during session. After bout of vomiting the pt is able to ambulate, continuing to demonstrate increased lateral drift and intermittent staggering. Pt may benefit from gait assessment with use of a RW as the pt seems to report she may not have 24/7 assistance at home as her son goes to dialysis and her daughter has a 29 year old to care for. PT will continue to follow, outpatient PT recommended.   If plan is discharge home, recommend the following: A little help with walking and/or transfers;A little help with bathing/dressing/bathroom;Assistance with cooking/housework;Assist for transportation;Help with stairs or ramp for entrance   Can travel by private vehicle        Equipment Recommendations  Rolling walker (2 wheels) (to be determined pending further progress)    Recommendations for Other Services       Precautions / Restrictions Precautions Precautions: Fall Recall of Precautions/Restrictions: Intact Restrictions Weight Bearing Restrictions Per Provider Order: No     Mobility  Bed Mobility Overal bed mobility: Needs Assistance Bed Mobility: Supine to Sit, Sit to Supine     Supine to sit: Supervision Sit to supine: Supervision        Transfers Overall transfer level: Needs assistance Equipment used: None Transfers: Sit to/from Stand Sit to Stand: Contact guard assist                Ambulation/Gait Ambulation/Gait assistance: Min assist Gait Distance (Feet): 200 Feet Assistive device: None Gait Pattern/deviations:  Step-through pattern Gait velocity: reduced Gait velocity interpretation: 1.31 - 2.62 ft/sec, indicative of limited community ambulator   General Gait Details: pt with increased lateral drift and intermittent posterior loss of balance   Stairs             Wheelchair Mobility     Tilt Bed    Modified Rankin (Stroke Patients Only)       Balance Overall balance assessment: Needs assistance Sitting-balance support: Feet supported, No upper extremity supported Sitting balance-Leahy Scale: Fair     Standing balance support: No upper extremity supported, During functional activity Standing balance-Leahy Scale: Poor                              Communication Communication Communication: No apparent difficulties  Cognition Arousal: Alert Behavior During Therapy: Impulsive   PT - Cognitive impairments: Awareness, Safety/Judgement                         Following commands: Intact      Cueing Cueing Techniques: Verbal cues  Exercises      General Comments General comments (skin integrity, edema, etc.): VSS on RA, pt with vomiting during session, RN provides zofran       Pertinent Vitals/Pain Pain Assessment Pain Assessment: Faces Faces Pain Scale: Hurts even more Pain Location: stomach Pain Descriptors / Indicators: Aching Pain Intervention(s): Monitored during session    Home Living  Prior Function            PT Goals (current goals can now be found in the care plan section) Acute Rehab PT Goals Patient Stated Goal: to return to prior level of function Progress towards PT goals: Progressing toward goals    Frequency    Min 2X/week      PT Plan      Co-evaluation              AM-PAC PT 6 Clicks Mobility   Outcome Measure  Help needed turning from your back to your side while in a flat bed without using bedrails?: A Little Help needed moving from lying on your back to sitting  on the side of a flat bed without using bedrails?: A Little Help needed moving to and from a bed to a chair (including a wheelchair)?: A Little Help needed standing up from a chair using your arms (e.g., wheelchair or bedside chair)?: A Little Help needed to walk in hospital room?: A Little Help needed climbing 3-5 steps with a railing? : A Lot 6 Click Score: 17    End of Session Equipment Utilized During Treatment: Gait belt Activity Tolerance: Treatment limited secondary to medical complications (Comment) (vomiting) Patient left: in bed;with call bell/phone within reach;with bed alarm set Nurse Communication: Mobility status PT Visit Diagnosis: Other abnormalities of gait and mobility (R26.89);Muscle weakness (generalized) (M62.81)     Time: 9075-9046 PT Time Calculation (min) (ACUTE ONLY): 29 min  Charges:    $Gait Training: 8-22 mins $Therapeutic Activity: 8-22 mins PT General Charges $$ ACUTE PT VISIT: 1 Visit                     Bernardino JINNY Ruth, PT, DPT Acute Rehabilitation Office [phone]    Bernardino JINNY Ruth 12/08/2023, 10:48 AM

## 2023-12-08 NOTE — Progress Notes (Signed)
 Patient ID: Kimberly Fry, female   DOB: 1965/06/15, 58 y.o.   MRN: [id number] BP 108/69   Pulse 81   Temp 98.9 F (37.2 C) (Oral)   Resp (!) 21   Ht 5' 4 (1.626 m)   Wt 49.1 kg   SpO2 99%   BMI 18.58 kg/m  Alert, and oriented x 4.  Moving all extremities Perrl, full eom, symmetric facies. Hearing intact to voice. I will restate my premise, I do not believe [name] needs another angiogram. Given the negative MRA, and the current negative CTA, and the exceedingly small possibility that a de novo aneurysm developed in 3 years that we have missed is not enough to justify another angio CT or other.  She is ok to transfer.

## 2023-12-08 NOTE — Plan of Care (Signed)
  Problem: Education: Goal: Knowledge of General Education information will improve Description: Including pain rating scale, medication(s)/side effects and non-pharmacologic comfort measures Outcome: Progressing   Problem: Activity: Goal: Risk for activity intolerance will decrease Outcome: Progressing   Problem: Elimination: Goal: Will not experience complications related to bowel motility Outcome: Progressing Goal: Will not experience complications related to urinary retention Outcome: Progressing   Problem: Pain Managment: Goal: General experience of comfort will improve and/or be controlled Outcome: Progressing   Problem: Coping: Goal: Will identify appropriate support needs Outcome: Progressing   Problem: Self-Care: Goal: Ability to participate in self-care as condition permits will improve Outcome: Progressing Goal: Ability to communicate needs accurately will improve Outcome: Progressing

## 2023-12-08 NOTE — Progress Notes (Signed)
 Transcranial Doppler  Date POD PCO2 HCT BP  MCA ACA PCA OPHT SIPH VERT Basilar  11/3 JH   34.9 133/72 Right  Left   *  *   *  *   *  *   17  16   *  *   -40  -33   -42      11/5 rs     Right  Left   *  43   *  -29   25  21   21  19    *  *   -34  -37   -56           Right  Left                                             Right  Left                                             Right  Left                                            Right  Left                                            Right  Left                                        MCA = Middle Cerebral Artery      OPHT = Opthalmic Artery     BASILAR = Basilar Artery   ACA = Anterior Cerebral Artery     SIPH = Carotid Siphon PCA = Posterior Cerebral Artery   VERT = Verterbral Artery                   Normal MCA = 62+\-12 ACA = 50+\-12 PCA = 42+\-23  (*) not insonated  Ricka Elder Molt, RDMS, RVT  12/08/2023 4:33 PM

## 2023-12-08 NOTE — Progress Notes (Signed)
 NAME:  Kimberly Fry, MRN:  [id number], DOB:  01-28-1966, LOS: 4 ADMISSION DATE:  12/04/2023, CONSULTATION DATE:  12/04/23 REFERRING MD: Prentice Medicus MD CHIEF COMPLAINT:  Chest Pain/Abdominal pain   History of Present Illness:  Pt is a 10 yr spanish speaking female with significant pmhx of diabetes, HTN, and HLD presenting to [facility] ED for complaints of chest pain/abdominal pain which began earlier this morning, she had been her usual state of health the day before. Patient reports that she has had an ongoing headache with intermittent vomiting. Upon initial work up in the ED, CT of head revealed a subarachnoid hemorrhage centered in the right sylvian fissure and basilar cisterns, greatest on the right but no intraparenchymal hemorrhage, hydrocephalus, or acute infarct. CT Angio Chest/abdomen/pelvis obtained to rule out aortic dissection which was negative for but did reveal large amount of stool burden in colon.   Upon arrival to [facility] Neuro ICU, patient off cleviprex. SBP 110s-130s. Utilized translator to assist with obtaining history. Patient stated that she hit her head on a piece of furniture about 8 days ago. Patient stated she did not fall but has issues with her vision due to her diabetes. She endorses that she can only see shadows nothing more. Per patient, her eye sight has been worsening over the last 1 yr. She is supposed to be on insulin  70/30 at home, along with glimepiride  but patient states she was only given these medications in the hospital. Patient mentions she lives with daughter at home and that is who brought her to the hospital. Prior to yesterday 11/1, patient was in her usual state of health. Patient confirms that headache and neck pain were a 10/10 but now a 6. Pain has improved.   Pertinent  Medical History   Past Medical History:  Diagnosis Date   Diabetes mellitus without complication (HCC)   HTN HLD   Significant Hospital Events: Including procedures,  antibiotic start and stop dates in addition to other pertinent events   12/04/23 PCCM Admit, patient with [facility] 11/2 UTI+, ceftriaxone  Interim History / Subjective:  No neuro changes, continues to complain of headache and face pain, somewhat helped with tylenol / fioricet.  Ongoing epigastric pain, helps if she pushes on it, feels empty but not hungry and has not been eating well.  Last BM prior to admit.   Objective    Blood pressure 122/69, pulse 65, temperature 98.2 F (36.8 C), temperature source Oral, resp. rate 14, height 5' 4 (1.626 m), weight 49.1 kg, SpO2 98%.        Intake/Output Summary (Last 24 hours) at 12/08/2023 9060 Last data filed at 12/07/2023 2000 Gross per 24 hour  Intake 900.01 ml  Output --  Net 900.01 ml   Filed Weights   12/04/23 1640 12/04/23 2152  Weight: 40 kg 49.1 kg   Examination: Bedside hospital interpreter utilized  General:  Older female lying in bed, appears not to feel well today, intermittently moans holding her face and epigastric HEENT: MM pink/moist, pupils 3/r Neuro: Awake, oriented x 3, MAE no focal deficits CV: rr, NSR PULM:  non labored, clear GI: soft, bs hyper, ND, no obvious tenderness, voids Extremities: warm/dry, no LE edema  Skin: no rashes   Afebrile Voiding> x6 unmeasured UOP Labs> K 3.8, glucose 261, sCr 0.63, Mag 2.2, WBC 9.1 CBG's liable > 96> 302  Resolved problem list   Assessment and Plan  SAH CT of head revealed a subarachnoid hemorrhage centered in the  right sylvian fissure and basilar cisterns, greatest on the right but no intraparenchymal hemorrhage, hydrocephalus, or acute infarct. CTA neg for aneurysm, repeat CTH 11/3 mildly decreased volume of SAH in similar distribution, unchanged ventricular size P: - per NSGY - plans for repeat CTA head/ neck 11/6 - SBP goal 90-140 - cont nimodipine - 7 days of keppra for seizure ppx - TCDS MWF - neuro watch - neuro protective measures - PT/ OT - SCDs only for  now - adding dexamethasone 4mg  q12 to help with headache with scheduled tylenol , prn Fioricet  HTN P: - bp goals as above - prn labetalol  HLD  P:  - statin  Uncontrolled DM with hyperglycemia Suspect patient has diabetic retinopathy from uncontrolled DM  Last hgb A1c >15 in 6/22, repeat A1c 9.5 this admit P: - with liable CBGs, unclear how much pt has been eating, but has been drinking juice and possibly other foods from family not c/w ordered diet could be contributing vs query component diabetic gastroparesis - cont lantus  5u BID - cont prn mSSI and add meal coverage if consumes > 50% - appreciate DM coordinator recs   Epigastric pain Constipation Nausea - normal LFTs, lipase on 11/1.  Query component of gastroparesis w/ hx of uncontrolled DM. Trial of reglan  - at times she hold her chest but this pain is reproducible with multiple EKGS non acute and neg troponin on admit, less concerning for cardiac etiology.    - on PPI - cont prn maalox, simethicone - scheduled miralax , senna and prn dulcolax   UTI, E.coli - completed 3 days ceftriaxone 11/4 - afebrile, WBC remains normal  Hypokalemia - trend on labs, replete prn  At risk for Malnutrition - given poor PO intake, RD consult for DM teaching and further nutrition recs   Medication Assistance  Patient not taking any medications at home - supposed to be on insulin  70/30, glimepiride , aspirin , lipitor  P: - TOC consulted   Critical care time:    CRITICAL CARE Performed by: Lyle Pesa   Total critical care time: 35 minutes  Critical care time was exclusive of separately billable procedures and treating other patients.  Critical care was necessary to treat or prevent imminent or life-threatening deterioration.  Critical care was time spent personally by me on the following activities: development of treatment plan with patient and/or surrogate as well as nursing, discussions with consultants, evaluation of  patient's response to treatment, examination of patient, obtaining history from patient or surrogate, ordering and performing treatments and interventions, ordering and review of laboratory studies, ordering and review of radiographic studies, pulse oximetry and re-evaluation of patient's condition.    Lyle Pesa, NP [facility] 12/08/2023, 9:39 AM  See Amion for pager If no response to pager , please call [phone] until 7pm After 7:00 pm call Elink  [phone]

## 2023-12-09 ENCOUNTER — Inpatient Hospital Stay (HOSPITAL_COMMUNITY)

## 2023-12-09 ENCOUNTER — Ambulatory Visit: Payer: Self-pay

## 2023-12-09 DIAGNOSIS — B9620 Unspecified Escherichia coli [E. coli] as the cause of diseases classified elsewhere: Secondary | ICD-10-CM

## 2023-12-09 DIAGNOSIS — R1013 Epigastric pain: Secondary | ICD-10-CM

## 2023-12-09 DIAGNOSIS — E785 Hyperlipidemia, unspecified: Secondary | ICD-10-CM

## 2023-12-09 DIAGNOSIS — E440 Moderate protein-calorie malnutrition: Secondary | ICD-10-CM | POA: Insufficient documentation

## 2023-12-09 DIAGNOSIS — R110 Nausea: Secondary | ICD-10-CM

## 2023-12-09 LAB — RENAL FUNCTION PANEL
Albumin: 3 g/dL — ABNORMAL LOW (ref 3.5–5.0)
Anion gap: 14 (ref 5–15)
BUN: 12 mg/dL (ref 6–20)
CO2: 20 mmol/L — ABNORMAL LOW (ref 22–32)
Calcium: 8.5 mg/dL — ABNORMAL LOW (ref 8.9–10.3)
Chloride: 100 mmol/L (ref 98–111)
Creatinine, Ser: 0.7 mg/dL (ref 0.44–1.00)
GFR, Estimated: >60 mL/min (ref >60)
Glucose, Bld: 340 mg/dL — ABNORMAL HIGH (ref 70–99)
Phosphorus: 3 mg/dL (ref 2.5–4.6)
Potassium: 4 mmol/L (ref 3.5–5.1)
Sodium: 134 mmol/L — ABNORMAL LOW (ref 135–145)

## 2023-12-09 LAB — GLUCOSE, CAPILLARY
Glucose-Capillary: 311 mg/dL — ABNORMAL HIGH (ref 70–99)
Glucose-Capillary: 169 mg/dL — ABNORMAL HIGH (ref 70–99)
Glucose-Capillary: 329 mg/dL — ABNORMAL HIGH (ref 70–99)
Glucose-Capillary: 238 mg/dL — ABNORMAL HIGH (ref 70–99)

## 2023-12-09 LAB — MAGNESIUM: Magnesium: 2 mg/dL (ref 1.7–2.4)

## 2023-12-09 MED ORDER — INSULIN ASPART 100 UNIT/ML IJ SOLN
0.0000 [IU] | INTRAMUSCULAR | Status: DC
  Administered 2023-12-09 – 2023-12-10 (×2): 7 [IU] via SUBCUTANEOUS
  Administered 2023-12-10: 4 [IU] via SUBCUTANEOUS
  Filled 2023-12-09 (×2): qty 5
  Filled 2023-12-09: qty 7

## 2023-12-09 MED ORDER — INSULIN GLARGINE-YFGN 100 UNIT/ML ~~LOC~~ SOLN
20.0000 [IU] | Freq: Every day | SUBCUTANEOUS | Status: DC
  Filled 2023-12-09: qty 0.2

## 2023-12-09 MED ORDER — POLYETHYLENE GLYCOL 3350 17 G PO PACK
17.0000 g | PACK | Freq: Two times a day (BID) | ORAL | Status: DC
  Administered 2023-12-09 – 2023-12-11 (×4): 17 g via ORAL
  Filled 2023-12-09 (×4): qty 1

## 2023-12-09 MED ORDER — SENNOSIDES-DOCUSATE SODIUM 8.6-50 MG PO TABS
2.0000 | ORAL_TABLET | Freq: Every evening | ORAL | Status: DC | PRN
Start: 2023-12-09 — End: 2023-12-11

## 2023-12-09 MED ORDER — IOHEXOL 350 MG/ML SOLN
75.0000 mL | Freq: Once | INTRAVENOUS | Status: AC | PRN
  Administered 2023-12-09: 75 mL via INTRAVENOUS

## 2023-12-09 MED ORDER — INSULIN GLARGINE-YFGN 100 UNIT/ML ~~LOC~~ SOLN
20.0000 [IU] | Freq: Every day | SUBCUTANEOUS | Status: DC
  Administered 2023-12-10: 20 [IU] via SUBCUTANEOUS
  Filled 2023-12-09: qty 0.2

## 2023-12-09 MED ORDER — INSULIN GLARGINE-YFGN 100 UNIT/ML ~~LOC~~ SOLN
7.0000 [IU] | Freq: Two times a day (BID) | SUBCUTANEOUS | Status: DC
  Filled 2023-12-09 (×2): qty 0.07

## 2023-12-09 MED ORDER — INSULIN ASPART 100 UNIT/ML IJ SOLN
0.0000 [IU] | INTRAMUSCULAR | Status: DC
  Administered 2023-12-09: 4 [IU] via SUBCUTANEOUS
  Administered 2023-12-09: 15 [IU] via SUBCUTANEOUS
  Filled 2023-12-09: qty 15
  Filled 2023-12-09: qty 4

## 2023-12-09 MED ORDER — INSULIN GLARGINE-YFGN 100 UNIT/ML ~~LOC~~ SOLN
15.0000 [IU] | Freq: Once | SUBCUTANEOUS | Status: AC
  Administered 2023-12-09: 15 [IU] via SUBCUTANEOUS
  Filled 2023-12-09: qty 0.15

## 2023-12-09 NOTE — Progress Notes (Signed)
 NAME:  Kimberly Fry, MRN:  [id number], DOB:  [birth year], LOS: 5 ADMISSION DATE:  12/04/2023, CONSULTATION DATE:  12/04/23 REFERRING MD: Prentice Medicus MD CHIEF COMPLAINT:  Chest Pain/Abdominal pain   History of Present Illness:  Pt is a 63 yr spanish speaking female with significant pmhx of diabetes, HTN, and HLD presenting to [facility] ED for complaints of chest pain/abdominal pain which began earlier this morning, she had been her usual state of health the day before. Patient reports that she has had an ongoing headache with intermittent vomiting. Upon initial work up in the ED, CT of head revealed a subarachnoid hemorrhage centered in the right sylvian fissure and basilar cisterns, greatest on the right but no intraparenchymal hemorrhage, hydrocephalus, or acute infarct. CT Angio Chest/abdomen/pelvis obtained to rule out aortic dissection which was negative for but did reveal large amount of stool burden in colon.   Upon arrival to [facility] Neuro ICU, patient off cleviprex. SBP 110s-130s. Utilized translator to assist with obtaining history. Patient stated that she hit her head on a piece of furniture about 8 days ago. Patient stated she did not fall but has issues with her vision due to her diabetes. She endorses that she can only see shadows nothing more. Per patient, her eye sight has been worsening over the last 1 yr. She is supposed to be on insulin  70/30 at home, along with glimepiride  but patient states she was only given these medications in the hospital. Patient mentions she lives with daughter at home and that is who brought her to the hospital. Prior to yesterday 11/1, patient was in her usual state of health. Patient confirms that headache and neck pain were a 10/10 but now a 6. Pain has improved.   Pertinent  Medical History   Past Medical History:  Diagnosis Date   Diabetes mellitus without complication (HCC)   HTN HLD   Significant Hospital Events: Including procedures,  antibiotic start and stop dates in addition to other pertinent events   12/04/23 PCCM Admit, patient with [facility] 11/2 UTI+, ceftriaxone 11/6 transferring out of ICU  Interim History / Subjective:  NAEON. CBG's high after adding decadron yesterday for headache. Starting to eat a bit more today. Denies headache.  Objective    Blood pressure 120/67, pulse 65, temperature 98.6 F (37 C), temperature source Oral, resp. rate 18, height 5' 4 (1.626 m), weight 49.1 kg, SpO2 97%.        Intake/Output Summary (Last 24 hours) at 12/09/2023 1009 Last data filed at 12/08/2023 2100 Gross per 24 hour  Intake 476 ml  Output --  Net 476 ml   Filed Weights   12/04/23 1640 12/04/23 2152  Weight: 40 kg 49.1 kg   Examination: General: Adult female, sitting up on edge of bed, in NAD. Neuro: A&O x 3, no deficits. HEENT: [place]/AT. Sclerae anicteric. EOMI. Cardiovascular: RRR, no M/R/G.  Lungs: Respirations even and unlabored.  CTA bilaterally, No W/R/R. Abdomen: BS x 4, soft, NT/ND.  Musculoskeletal: No gross deformities, no edema.    Assessment and Plan  SAH CT of head revealed a subarachnoid hemorrhage centered in the right sylvian fissure and basilar cisterns, greatest on the right but no intraparenchymal hemorrhage, hydrocephalus, or acute infarct. CTA neg for aneurysm, repeat CTH 11/3 mildly decreased volume of SAH in similar distribution, unchanged ventricular size P: - NSGY not recommending repeat angiogram - Continue nimodipine - 7 days of keppra for seizure ppx (stop 11/10) - TCDS MWF - neuro protective  measures - PT/ OT - Continue Dexamethasone, Tylenol , prn Fioricet for headache - Stable for transfer out of ICU per NSGY  HTN, HLD P: - bp goals as above - prn labetalol, atorvastatin    Uncontrolled DM with hyperglycemia - exacerbated by steroids Suspect patient has diabetic retinopathy from uncontrolled DM  Last hgb A1c >15 in 6/22, repeat A1c 9.5 this admit P: - cont lantus ,  pharmacy to assist with dosing (considering changing from BID to only daily with increased dose).  - Change SSI to resistant scale - DM educator following  Epigastric pain Constipation Nausea - normal LFTs, lipase on 11/1.  Query component of gastroparesis w/ hx of uncontrolled DM. Trial of reglan  - at times she hold her chest but this pain is reproducible with multiple EKGS non acute and neg troponin on admit, less concerning for cardiac etiology.    - Continue PPI, prn maalox, simethicone - scheduled miralax , senna and prn dulcolax   UTI, E.coli - s/p 3 days CTX completed 11/4 - no further interventions required  At risk for Malnutrition - dietician following  Medication Assistance  Patient not taking any medications at home - supposed to be on insulin  70/30, glimepiride , aspirin , lipitor  P: - TOC consulted    Stable for transfer out of ICU. Will ask TRH to assume care in AM 11/7 with PCCM off.   Sammi Gore, PA - [facility] For pager details, please see AMION or use Epic chat  After 1900, please call [facility] for cross coverage needs 12/09/2023, 10:23 AM

## 2023-12-09 NOTE — Plan of Care (Signed)
  Problem: Education: Goal: Knowledge of General Education information will improve Description: Including pain rating scale, medication(s)/side effects and non-pharmacologic comfort measures Outcome: Progressing   Problem: Clinical Measurements: Goal: Diagnostic test results will improve Outcome: Progressing Goal: Respiratory complications will improve Outcome: Progressing   Problem: Activity: Goal: Risk for activity intolerance will decrease Outcome: Progressing   Problem: Nutrition: Goal: Adequate nutrition will be maintained Outcome: Progressing   Problem: Coping: Goal: Level of anxiety will decrease Outcome: Progressing   Problem: Elimination: Goal: Will not experience complications related to urinary retention Outcome: Progressing   Problem: Pain Managment: Goal: General experience of comfort will improve and/or be controlled Outcome: Progressing   Problem: Spontaneous Subarachnoid Hemorrhage Tissue Perfusion: Goal: Complications of Spontaneous Subarachnoid Hemorrhage will be minimized Outcome: Progressing   Problem: Coping: Goal: Will identify appropriate support needs Outcome: Progressing   Problem: Self-Care: Goal: Ability to participate in self-care as condition permits will improve Outcome: Progressing Goal: Ability to communicate needs accurately will improve Outcome: Progressing   Problem: Nutrition: Goal: Dietary intake will improve Outcome: Progressing   Problem: Education: Goal: Ability to describe self-care measures that may prevent or decrease complications (Diabetes Survival Skills Education) will improve Outcome: Progressing   Problem: Nutritional: Goal: Maintenance of adequate nutrition will improve Outcome: Progressing

## 2023-12-09 NOTE — Progress Notes (Signed)
 1815 arrived to unit via WC. Placed on tele box 12. Assisted to BR to void. Denies N&VB or pain. Spanish speaking. Family at bedside.

## 2023-12-10 ENCOUNTER — Inpatient Hospital Stay (HOSPITAL_COMMUNITY)

## 2023-12-10 DIAGNOSIS — K5900 Constipation, unspecified: Secondary | ICD-10-CM

## 2023-12-10 LAB — PHOSPHORUS: Phosphorus: 3 mg/dL (ref 2.5–4.6)

## 2023-12-10 LAB — GLUCOSE, CAPILLARY
Glucose-Capillary: 185 mg/dL — ABNORMAL HIGH (ref 70–99)
Glucose-Capillary: 192 mg/dL — ABNORMAL HIGH (ref 70–99)
Glucose-Capillary: 240 mg/dL — ABNORMAL HIGH (ref 70–99)
Glucose-Capillary: 296 mg/dL — ABNORMAL HIGH (ref 70–99)
Glucose-Capillary: 315 mg/dL — ABNORMAL HIGH (ref 70–99)
Glucose-Capillary: 316 mg/dL — ABNORMAL HIGH (ref 70–99)
Glucose-Capillary: 387 mg/dL — ABNORMAL HIGH (ref 70–99)
Glucose-Capillary: 430 mg/dL — ABNORMAL HIGH (ref 70–99)

## 2023-12-10 LAB — CBC
HCT: 35.5 % — ABNORMAL LOW (ref 36.0–46.0)
Hemoglobin: 12.2 g/dL (ref 12.0–15.0)
MCH: 27.7 pg (ref 26.0–34.0)
MCHC: 34.4 g/dL (ref 30.0–36.0)
MCV: 80.7 fL (ref 80.0–100.0)
Platelets: 234 K/uL (ref 150–400)
RBC: 4.4 MIL/uL (ref 3.87–5.11)
RDW: 12.4 % (ref 11.5–15.5)
WBC: 10.1 K/uL (ref 4.0–10.5)
nRBC: 0 % (ref 0.0–0.2)

## 2023-12-10 LAB — GLUCOSE, RANDOM: Glucose, Bld: 460 mg/dL — ABNORMAL HIGH (ref 70–99)

## 2023-12-10 LAB — MAGNESIUM: Magnesium: 1.9 mg/dL (ref 1.7–2.4)

## 2023-12-10 MED ORDER — INSULIN ASPART 100 UNIT/ML IJ SOLN
0.0000 [IU] | Freq: Three times a day (TID) | INTRAMUSCULAR | Status: DC
  Administered 2023-12-10: 15 [IU] via SUBCUTANEOUS
  Administered 2023-12-10: 20 [IU] via SUBCUTANEOUS
  Administered 2023-12-10: 15 [IU] via SUBCUTANEOUS
  Administered 2023-12-11 (×2): 4 [IU] via SUBCUTANEOUS
  Filled 2023-12-10: qty 15
  Filled 2023-12-10: qty 6
  Filled 2023-12-10: qty 15
  Filled 2023-12-10: qty 2
  Filled 2023-12-10: qty 20

## 2023-12-10 MED ORDER — INSULIN ASPART 100 UNIT/ML IJ SOLN
0.0000 [IU] | Freq: Every day | INTRAMUSCULAR | Status: DC
  Administered 2023-12-10: 5 [IU] via SUBCUTANEOUS
  Filled 2023-12-10: qty 5

## 2023-12-10 MED ORDER — BISACODYL 10 MG RE SUPP
10.0000 mg | Freq: Two times a day (BID) | RECTAL | Status: DC
  Administered 2023-12-11: 10 mg via RECTAL
  Filled 2023-12-10: qty 1

## 2023-12-10 MED ORDER — SORBITOL 70 % SOLN
60.0000 mL | Freq: Once | Status: AC
  Administered 2023-12-10: 60 mL via ORAL
  Filled 2023-12-10: qty 60

## 2023-12-10 MED ORDER — SMOG ENEMA
960.0000 mL | Freq: Once | RECTAL | Status: AC
  Administered 2023-12-10: 960 mL via RECTAL
  Filled 2023-12-10: qty 960

## 2023-12-10 MED ORDER — INSULIN GLARGINE-YFGN 100 UNIT/ML ~~LOC~~ SOLN
20.0000 [IU] | Freq: Two times a day (BID) | SUBCUTANEOUS | Status: DC
  Administered 2023-12-10 – 2023-12-11 (×2): 20 [IU] via SUBCUTANEOUS
  Filled 2023-12-10 (×3): qty 0.2

## 2023-12-10 MED ORDER — BISACODYL 10 MG RE SUPP
10.0000 mg | Freq: Once | RECTAL | Status: DC
Start: 2023-12-10 — End: 2023-12-10

## 2023-12-10 MED ORDER — SMOG ENEMA: 300.0000 mL | Freq: Once | RECTAL | Status: DC

## 2023-12-10 NOTE — Progress Notes (Signed)
 Physical Therapy Treatment Patient Details Name: Kimberly Fry MRN: [id number] DOB: 09/09/1965 Today's Date: 12/10/2023   History of Present Illness 58 yo female presents to ED 11/1 with x1 day headache and nausea/vomiting. CTH shows SAH centered in R sylvian fissure and basilar cisterns R>L. PMH includes  diabetes, hypertension, and hyperlipidemia.    PT Comments  Pt received in supine and agreeable to PT session. When ambulating with no AD, pt tends to drift right/left with mild instability requiring MinA to correct. Trialed use of RW with improved stability and CGA for safety. Pt reported feeling more comfortable when ambulating with RW. Also worked on psychologist, counselling with pt able to negotiate 8 steps with no handrails. MinA required for slight steadying assist. Discussed having family/friends close by when negotiating steps to assist with balance. Pt reported that her son will be able to assist upon d/c home. Continue to recommend OP PT with acute PT to follow.     If plan is discharge home, recommend the following: A little help with walking and/or transfers;A little help with bathing/dressing/bathroom;Assistance with cooking/housework;Assist for transportation;Help with stairs or ramp for entrance   Can travel by private vehicle      Yes  Equipment Recommendations  Other (comment) (Youth RW)       Precautions / Restrictions Precautions Precautions: Fall Recall of Precautions/Restrictions: Intact Restrictions Weight Bearing Restrictions Per Provider Order: No     Mobility  Bed Mobility Overal bed mobility: Needs Assistance Bed Mobility: Supine to Sit, Sit to Supine    Supine to sit: Min assist Sit to supine: Supervision   General bed mobility comments: MinA for slight steadying assist upon raising trunk    Transfers Overall transfer level: Needs assistance Equipment used: None, Rolling walker (2 wheels) Transfers: Sit to/from Stand Sit to Stand: Contact guard  assist    General transfer comment: for safety, cues for hand placement when using RW    Ambulation/Gait Ambulation/Gait assistance: Min assist, Contact guard assist Gait Distance (Feet): 600 Feet Assistive device: None, Rolling walker (2 wheels) Gait Pattern/deviations: Step-through pattern, Drifts right/left, Decreased stride length Gait velocity: reduced     General Gait Details: Increased lateral drift with MinA for occasional steadying assist. Improved stability with RW with CGA for safety   Stairs Stairs: Yes Stairs assistance: Min assist Stair Management: No rails, One rail Right, One rail Left, Step to pattern, Forwards Number of Stairs: 10 General stair comments: able to negotiate with bilateral handrails and no handrails. Step to pattern with MinA for steadying assist   Modified Rankin (Stroke Patients Only) Modified Rankin (Stroke Patients Only) Pre-Morbid Rankin Score: No symptoms Modified Rankin: Moderately severe disability     Balance Overall balance assessment: Needs assistance Sitting-balance support: Feet supported, No upper extremity supported Sitting balance-Leahy Scale: Fair  Standing balance support: No upper extremity supported Standing balance-Leahy Scale: Poor    High level balance activites: Other (comment) High Level Balance Comments: stepping over obstacles x5       Communication Communication Communication: Impaired Factors Affecting Communication: Non - English speaking, interpreter not available (video interpreter utilizedGLENWOOD Harpin [phone])  Cognition Arousal: Alert Behavior During Therapy: WFL for tasks assessed/performed   PT - Cognitive impairments: Awareness, Safety/Judgement    Following commands: Intact      Cueing Cueing Techniques: Verbal cues  Exercises Other Exercises Other Exercises: x8 sit<>stand with RW        Pertinent Vitals/Pain Pain Assessment Pain Assessment: Faces Faces Pain Scale: Hurts little  more  Pain Location: bilateral posterior thighs Pain Descriptors / Indicators: Aching Pain Intervention(s): Limited activity within patient's tolerance, Monitored during session, Repositioned     PT Goals (current goals can now be found in the care plan section) Acute Rehab PT Goals PT Goal Formulation: With patient Time For Goal Achievement: 12/20/23 Potential to Achieve Goals: Good Progress towards PT goals: Progressing toward goals    Frequency    Min 2X/week       AM-PAC PT 6 Clicks Mobility   Outcome Measure  Help needed turning from your back to your side while in a flat bed without using bedrails?: A Little Help needed moving from lying on your back to sitting on the side of a flat bed without using bedrails?: A Little Help needed moving to and from a bed to a chair (including a wheelchair)?: A Little Help needed standing up from a chair using your arms (e.g., wheelchair or bedside chair)?: A Little Help needed to walk in hospital room?: A Little Help needed climbing 3-5 steps with a railing? : A Little 6 Click Score: 18    End of Session Equipment Utilized During Treatment: Gait belt Activity Tolerance: Patient tolerated treatment well Patient left: in bed;with call bell/phone within reach;with bed alarm set Nurse Communication: Mobility status PT Visit Diagnosis: Other abnormalities of gait and mobility (R26.89);Muscle weakness (generalized) (M62.81)     Time: 9085-9056 PT Time Calculation (min) (ACUTE ONLY): 29 min  Charges:    $Gait Training: 8-22 mins $Therapeutic Activity: 8-22 mins PT General Charges $$ ACUTE PT VISIT: 1 Visit                    Kate ORN, PT, DPT Secure Chat Preferred  Rehab Office [phone]   Kate BRAVO Kimberly Fry 12/10/2023, 10:16 AM

## 2023-12-10 NOTE — Progress Notes (Signed)
 12/10/23  Seen in f/u for [facility]  S: No events, some unsteadiness on feet Very constipated CBG up Translator used  O:    12/11/2023    8:15 AM 12/11/2023    3:59 AM 12/10/2023   11:15 PM  Vitals with BMI  Systolic 132 104 858  Diastolic 83 70 74  Pulse 73 63 70  No distress RASS 0 Moves everything to command Abd soft, hypoactive BS No edema  A: SAH non-aneurysmal, stable, no further w/u needed per NSGY Constipation DM with hyperglycemia, poorly controlled and exacerbated by steroids  P: Adjusted steroids Maintain normotension Diabetes educator consult, PCP consult Strengthen bowel regimen To get walker later tonight Appreciate TRH taking over starting 12/11/23 Patient agrees with plan   Rolan Sharps MD Pulmonary Critical Care Medicine Securechat if during day (7a-7p) [phone] if after hours (7p-7a)

## 2023-12-10 NOTE — Progress Notes (Signed)
 Patient ID: Kimberly Fry, female   DOB: 11-23-1965, 58 y.o.   MRN: [id number] BP (!) 149/70 (BP Location: Left Arm)   Pulse 75   Temp 98.1 F (36.7 C) (Oral)   Resp 16   Ht 5' 4 (1.626 m)   Wt 49.1 kg   SpO2 100%   BMI 18.58 kg/m  Alert and oriented x4, speech is clear, and fluent Ambulating well Tolerating a regular diet Will not need followup for the [facility]. May be discharged when other medical problems are controlled.

## 2023-12-10 NOTE — Inpatient Diabetes Management (Addendum)
 Inpatient Diabetes Program Recommendations  AACE/ADA: New Consensus Statement on Inpatient Glycemic Control (2015)  Target Ranges:  Prepandial:   less than 140 mg/dL      Peak postprandial:   less than 180 mg/dL (1-2 hours)      Critically ill patients:  140 - 180 mg/dL   Lab Results  Component Value Date   GLUCAP 316 (H) 12/10/2023   HGBA1C 9.5 (H) 12/05/2023    Review of Glycemic Control  Diabetes history: DM2 Outpatient Diabetes medications: 70/30?? insulin  if blood sugar <150 mg/dl, does not take; blood sugar 250-300 takes 20-25 units, blood sugar >400 mg/dl takes 30 units; checks blood sugars twice per day with meter; uses vial and syringe Current orders for Inpatient glycemic control:   Inpatient Diabetes Program Recommendations:   Spoke with patient with interpreter [phone] on [facility]. Patient states that she has not seen a PCP for at least 1 year; states that it was costing her $400 per visit. States that her son is on dialysis; he does not own a car, but can borrow one if patient needs to go to doctor.   Patient could not tell me what insulin  she is taking. She follows her own scale per blood sugars.  If blood sugar less than 150 mg/dl not insulin  given Blood sugars >250-300 mg/dl takes 79-74 units Blood sugar >300-400 takes 30 units   Son helps her with her insulin  at home. Daughter helps her pay for the insulin  $40-$45. Uses syringe and vial. Is she taking Novolin NPH or Novolin 70/30?   Spoke with case manager, Kelli, to see if patient could get in a clinic for PCP. Patient could get insulin  from the clinic if able to get an appointment which is very difficult.   Spoke with staff RN about the patient's high blood sugars. States that she was drinking Glucerna and Juven at 1000 this morning. Probably the reason her blood sugars are elevated.   This is a difficult situation. Recommend that she follow her regimen and check blood sugars at least twice a day. Will need a PCP  that she can afford.Spoke with pharmacist at [facility] on [address] and Novolin NPH insulin  was the only insulin  listed.    Recommend:  Novolin NPH 20 units BID if blood sugars 200-300 mg/dl  25 units BID if blood sugar >300 mg/dl.   Marjorie Lunger RN BSN CDE Diabetes Coordinator Pager: [phone]  8am-5pm

## 2023-12-10 NOTE — Plan of Care (Signed)
 CBG greater than 400. STAT labs and new order for Lantus  and give 20 units of Humalog at supper time. Not eating supper. Waiting for bowel meds to work to go home if she can have good BM.   Problem: Education: Goal: Knowledge of General Education information will improve Description: Including pain rating scale, medication(s)/side effects and non-pharmacologic comfort measures Outcome: Progressing   Problem: Activity: Goal: Risk for activity intolerance will decrease Outcome: Progressing   Problem: Nutrition: Goal: Adequate nutrition will be maintained Outcome: Progressing   Problem: Safety: Goal: Ability to remain free from injury will improve Outcome: Progressing   Problem: Skin Integrity: Goal: Risk for impaired skin integrity will decrease Outcome: Progressing

## 2023-12-10 NOTE — Plan of Care (Signed)
  Problem: Education: Goal: Knowledge of General Education information will improve Description: Including pain rating scale, medication(s)/side effects and non-pharmacologic comfort measures Outcome: Progressing   Problem: Health Behavior/Discharge Planning: Goal: Ability to manage health-related needs will improve Outcome: Progressing   Problem: Clinical Measurements: Goal: Will remain free from infection Outcome: Progressing   Problem: Elimination: Goal: Will not experience complications related to urinary retention Outcome: Progressing   Problem: Education: Goal: Knowledge of disease or condition will improve Outcome: Progressing Goal: Knowledge of secondary prevention will improve (MUST DOCUMENT ALL) Outcome: Progressing Goal: Knowledge of patient specific risk factors will improve (DELETE if not current risk factor) Outcome: Progressing   Problem: Spontaneous Subarachnoid Hemorrhage Tissue Perfusion: Goal: Complications of Spontaneous Subarachnoid Hemorrhage will be minimized Outcome: Progressing

## 2023-12-10 NOTE — Discharge Summary (Incomplete)
 Physician Discharge Summary   Patient ID: Kimberly Fry MRN: [id number] DOB/AGE: 58/30/1967 58 y.o.  Admit date: 12/04/2023 Discharge date: 12/10/2023                     Discharge Plan by Diagnosis   Non-aneurysmal SAH. Headache - 2/2 above. - Continue Keppra through 11/10 (7 days total). - Continue Tylenol  and Fioricet for headache. - F/u with PCP as scheduled.  Hx HTN, HLD. - Continue Atorvastatin , ASA. - F/u with PCP as scheduled, might need to initiate a antihypertensive.  Uncontrolled DM - Hgb A1c 9.5. - DM educator recommending the following regimen on discharge along with close PCP follow up: Novolin NPH 20 units BID if CBGs 200 -300 or 25 units BID if CBGs > 300.  Nausea with intermittent epigastric pain and possible gastroparesis. - Continue Reglan, PPI, Simethicon. - F/u with PCP as scheduled.  Constipation - resolved after Sorbitol/SMOG enema/Dulcolax suppository 11/7. *** - Continue Dulcolax suppositories, Miralax . - Encourage fiber and fluids intake. - Mobilize.  UTI - s/p 3 days CTX. - No further interventions required.  Discharge Summary  ***  Kimberly Fry is a 58 y.o. y/o female with a PMH of of HTN, HLD, DM. She presented to [facility] ED on 11/1 with headache and intermittent vomiting. She had a CT of the head that demonstrated a SAH. Per history, she had apparently hit her head on a piece of furniture roughly 8 days prior to this but did not fall or have any major symptoms thereafter. She was subsequently transferred to [facility] Neuro ICU where she was admitted on 11/2 for ongoing care. SBP on admission around 130 - 160.  CTA of the head was negative for aneurysm. F/u CT Head 11/3 showed mildly decreased SAH volume. F/u CTA head and neck 11/6 was again negative for aneurysm. TCD's 11/3 and 11/5 did not show vasospasm and Nimotop was stopped 11/7.  She was found to have a UTI for which she was treated with Ceftriaxone.  On 11/5, she was started on  Decadron to help with headaches. As expected, she had a bump in her CBG's thereafter and her insulin  regimen was adjusted.  On 11/6 she was transferred out of the ICU.  On 11/7, she met with diabetes educator along with TOC for assistance with home meds and PCP establishment/appointment (had not had PCP prior and was not taking any of her meds as prescribed from prior admissions).  Due to not having a bowel movement in the preceding 7 days, she received a SMOG enema afternoon of 11/7 with not much success. Sorbitol PO was added. Dulcolax suppository was changed from PRN to BID dosing. Miralax  was continued.  11/7 Neurosurgery was contacted to confirm there were no further interventions required from there standpoint. She was subsequently discharged home 11/8 *** with instructions to follow up with her PCP as scheduled thanks to the assistance of TOC.          Consults  Neurosurgery  Objective:  Blood pressure 114/65, pulse 67, temperature 98 F (36.7 C), temperature source Oral, resp. rate 16, height 5' 4 (1.626 m), weight 49.1 kg, SpO2 100%.       No intake or output data in the 24 hours ending 12/10/23 1509 Filed Weights   12/04/23 1640 12/04/23 2152  Weight: 40 kg 49.1 kg    Physical Examination: General: *** Neuro: *** HEENT: *** Cardiovascular: *** Lungs: *** Abdomen: *** Musculoskeletal: *** Skin: *** GU: ***  Discharge Labs:  BMET Recent Labs  Lab 12/04/23 1654 12/05/23 0928 12/06/23 0554 12/07/23 0327 12/08/23 0230 12/09/23 0239 12/09/23 0240 12/10/23 0155  NA 137  --  142 140 137  --  134*  --   K 3.7  --  2.9* 3.8 3.8  --  4.0  --   CL 101  --  108 107 102  --  100  --   CO2 28  --  22 23 23   --  20*  --   GLUCOSE 395*  --  142* 199* 261*  --  340*  --   BUN 17  --  7 6 7   --  12  --   CREATININE 0.69  --  0.57 0.50 0.63  --  0.70  --   CALCIUM  9.4  --  8.0* 8.6* 8.7*  --  8.5*  --   MG  --  1.8 1.9 2.2  --  2.0  --  1.9  PHOS  --  2.8  --   --    --   --  3.0 3.0    CBC Recent Labs  Lab 12/07/23 0327 12/08/23 0230 12/10/23 0155  HGB 11.9* 12.1 12.2  HCT 35.1* 35.9* 35.5*  WBC 7.3 9.1 10.1  PLT 192 211 234    Anti-Coagulation Recent Labs  Lab 12/05/23 0928  INR 1.0    Discharge Instructions     Ambulatory referral to Physical Therapy   Complete by: As directed          Follow-up Information     Kimberly Saddie FALCON, PA-C Follow up.   Specialty: Physician Assistant Why: TIME : 10:00 AM   PLEASE ARRIVE AT 9:30 AM DATE : February 02, 2024 WEDNESDAY--Cancel this appt if attend the one on the 2nd  PLEASE BRING ALL CURRENT MEDICATION, ID and INS CARD APPT FOR NOVEMBER 06,2025 HAS BEEN [facility] IN HOSPITAL Contact information: [address] Kimberly Fry [place] Kimberly [place] [address] [phone]         [facility] Follow up on 01/04/2024.   Why: Your appt is at 2:45 pm. Please arrive early and bring ID and insurance card Contact information: [address] Kimberly [place]  [address] [phone]        [facility] at [facility]. Schedule an appointment as soon as possible for a visit.   Specialty: Rehabilitation Contact information: [address] [place] Kimberly [place]  [address] [phone]                 Allergies as of 12/10/2023   No Known Allergies   Med Rec must be completed prior to using this [facility]***        Durable Medical Equipment  (From admission, onward)           Start     Ordered   12/10/23 1345  For home use only DME Walker youth  Once       Question:  Patient needs a walker to treat with the following condition  Answer:  Weakness   12/10/23 1344   12/10/23 1229  For home use only DME Walker rolling  Once       Question Answer Comment  Walker: With 5 Inch Wheels   Patient needs a walker to treat with the following condition Weakness      12/10/23 1229              Disposition: ***   Discharge Condition:  Kimberly Fry has met maximum benefit of inpatient care and is medically stable and cleared for discharge.  Patient is pending follow up as above. ***    Time spent on discharge: Greater than 30 minutes.

## 2023-12-10 NOTE — TOC Transition Note (Signed)
 Transition of Care [facility]) - Discharge Note   Patient Details  Name: Kimberly Fry MRN: [id number] Date of Birth: 08/08/1965  [facility] CM/SW Contact:  Andrez JULIANNA George, RN Phone Number: 12/10/2023, 2:01 PM   Clinical Narrative:     Pt is discharging home with outpatient therapy through [facility] Information on the AVS. Pt will call to schedule the first appointment.  Walker ordered through [employer] and will be delivered to the room.  PCP appt on AVS.  Pt states her son will provide transport home. CM sent information through [employer], with pt permission for assistance with transportation.  Final next level of care: OP Rehab Barriers to Discharge: No Barriers Identified   Patient Goals and CMS Choice     Choice offered to / list presented to : Patient      Discharge Placement                       Discharge Plan and Services Additional resources added to the After Visit Summary for     Discharge Planning Services: CM Consult, Follow-up appt scheduled                                 Social Drivers of Health (SDOH) Interventions SDOH Screenings   Food Insecurity: No Food Insecurity (12/08/2023)  Housing: High Risk (12/08/2023)  Transportation Needs: Unmet Transportation Needs (12/06/2023)  Utilities: Not At Risk (12/06/2023)  Tobacco Use: Low Risk  (12/04/2023)     Readmission Risk Interventions     No data to display

## 2023-12-11 ENCOUNTER — Other Ambulatory Visit (HOSPITAL_COMMUNITY): Payer: Self-pay

## 2023-12-11 ENCOUNTER — Inpatient Hospital Stay (HOSPITAL_COMMUNITY)

## 2023-12-11 DIAGNOSIS — I609 Nontraumatic subarachnoid hemorrhage, unspecified: Secondary | ICD-10-CM | POA: Diagnosis not present

## 2023-12-11 DIAGNOSIS — L8990 Pressure ulcer of unspecified site, unspecified stage: Secondary | ICD-10-CM | POA: Insufficient documentation

## 2023-12-11 LAB — GLUCOSE, CAPILLARY
Glucose-Capillary: 200 mg/dL — ABNORMAL HIGH (ref 70–99)
Glucose-Capillary: 166 mg/dL — ABNORMAL HIGH (ref 70–99)
Glucose-Capillary: 166 mg/dL — ABNORMAL HIGH (ref 70–99)

## 2023-12-11 LAB — BASIC METABOLIC PANEL WITH GFR
Anion gap: 11 (ref 5–15)
BUN: 17 mg/dL (ref 6–20)
CO2: 24 mmol/L (ref 22–32)
Calcium: 8.7 mg/dL — ABNORMAL LOW (ref 8.9–10.3)
Chloride: 103 mmol/L (ref 98–111)
Creatinine, Ser: 0.54 mg/dL (ref 0.44–1.00)
GFR, Estimated: >60 mL/min
Glucose, Bld: 96 mg/dL (ref 70–99)
Potassium: 3.5 mmol/L (ref 3.5–5.1)
Sodium: 138 mmol/L (ref 135–145)

## 2023-12-11 LAB — MAGNESIUM: Magnesium: 1.8 mg/dL (ref 1.7–2.4)

## 2023-12-11 LAB — PHOSPHORUS: Phosphorus: 3 mg/dL (ref 2.5–4.6)

## 2023-12-11 MED ORDER — ONDANSETRON 4 MG PO TBDP
4.0000 mg | ORAL_TABLET | Freq: Three times a day (TID) | ORAL | 0 refills | Status: AC | PRN
  Filled 2023-12-11: qty 20, 7d supply, fill #0

## 2023-12-11 MED ORDER — BUTALBITAL-APAP-CAFFEINE 50-325-40 MG PO TABS
1.0000 | ORAL_TABLET | Freq: Once | ORAL | Status: AC
  Administered 2023-12-11: 1 via ORAL
  Filled 2023-12-11: qty 1

## 2023-12-11 MED ORDER — FLEET ENEMA RE ENEM
1.0000 | ENEMA | Freq: Once | RECTAL | Status: AC
  Administered 2023-12-11: 1 via RECTAL
  Filled 2023-12-11: qty 1

## 2023-12-11 MED ORDER — LACTULOSE 10 GM/15ML PO SOLN
30.0000 g | Freq: Once | ORAL | Status: AC
  Administered 2023-12-11: 30 g via ORAL
  Filled 2023-12-11: qty 60

## 2023-12-11 MED ORDER — LEVETIRACETAM 500 MG PO TABS
500.0000 mg | ORAL_TABLET | Freq: Two times a day (BID) | ORAL | 0 refills | Status: DC
  Filled 2023-12-11: qty 6, 3d supply, fill #0

## 2023-12-11 MED ORDER — OXYCODONE HCL 5 MG PO TABS
5.0000 mg | ORAL_TABLET | ORAL | 0 refills | Status: AC | PRN
  Filled 2023-12-11: qty 10, 3d supply, fill #0

## 2023-12-11 MED ORDER — ATORVASTATIN CALCIUM 40 MG PO TABS
40.0000 mg | ORAL_TABLET | Freq: Every day | ORAL | 0 refills | Status: DC
  Filled 2023-12-11: qty 90, 90d supply, fill #0

## 2023-12-11 MED ORDER — INSULIN NPH (HUMAN) (ISOPHANE) 100 UNIT/ML ~~LOC~~ SUSP
25.0000 [IU] | Freq: Two times a day (BID) | SUBCUTANEOUS | 0 refills | Status: DC
  Filled 2023-12-11: qty 10, 20d supply, fill #0

## 2023-12-11 MED ORDER — OXYCODONE HCL 5 MG PO TABS
10.0000 mg | ORAL_TABLET | Freq: Four times a day (QID) | ORAL | Status: DC | PRN
  Administered 2023-12-11: 10 mg via ORAL
  Filled 2023-12-11: qty 2

## 2023-12-11 NOTE — Discharge Summary (Signed)
 Physician Discharge Summary  [facility] Kimberly Fry FMW:[id number] DOB: 10/17/1965 DOA: 12/04/2023  PCP: Freddrick, No  Admit date: 12/04/2023 Discharge date: 12/11/2023 30 Day Unplanned Readmission Risk Score    Flowsheet Row ED to Hosp-Admission (Current) from 12/04/2023 in [facility]  30 Day Unplanned Readmission Risk Score (%) 10.2 Filed at 12/11/2023 1200    This score is the patient's risk of an unplanned readmission within 30 days of being discharged (0 -100%). The score is based on dignosis, age, lab data, medications, orders, and past utilization.   Low:  0-14.9   Medium: 15-21.9   High: 22-29.9   Extreme: 30 and above          Admitted From: Home Disposition: Home  Recommendations for Outpatient Follow-up:  Follow up with PCP in 1-2 weeks Please obtain BMP/CBC in one week Please follow up with your PCP on the following pending results: Unresulted Labs (From admission, onward)    None         Home Health: None Equipment/Devices: None  Discharge Condition: Stable CODE STATUS: Full code Diet recommendation:  Diet Order             Diet Carb Modified Fluid consistency: Thin; Room service appropriate? Yes with Assist  Diet effective now                   Subjective: Patient seen and examined earlier, she was complaining of headache as well as constipation was ongoing.  Due to recent subarachnoid hemorrhage, we obtained stat CT head which thankfully showed improving subarachnoid hemorrhage.  It appeared that patient was having headache secondary to straining due to constipation.  She then had a bowel movement as well.  She felt nauseous while having the bowel movement, again due to straining.  She received IV Zofran .  She was given 1 dose of oxycodone 10 mg as well.  Few hours after that, patient was in much better shape, sleeping comfortably when I went to see her again.  No concerns by the nurses.  Hemodynamically stable as well as blood sugar is  much better controlled.  Patient is stable for discharge.  I used remote interpreter in the room for conversation with the patient.  Brief/Interim Summary: Pt is a 58 yr spanish speaking female with significant pmhx of diabetes, HTN, and HLD presented to [facility] ED for complaints of chest pain/abdominal pain which began earlier this morning, she had been her usual state of health the day before. Patient reported  ongoing headache with intermittent vomiting. Upon initial work up in the ED, CT of head revealed a subarachnoid hemorrhage centered in the right sylvian fissure and basilar cisterns, greatest on the right but no intraparenchymal hemorrhage, hydrocephalus, or acute infarct. CT Angio Chest/abdomen/pelvis obtained to rule out aortic dissection which was negative for but did reveal large amount of stool burden in colon.  Patient was admitted under critical care in ICU.   Upon arrival to [facility] Neuro ICU, patient off cleviprex. SBP 110s-130s.  Per documentation, patient stated that she hit her head on a piece of furniture about 8 days ago. Patient stated she did not fall but has issues with her vision due to her diabetes.  Per patient, her eye sight has been worsening over the last 1 yr. She is supposed to be on insulin  70/30 at home, along with glimepiride  but patient was not taking those medications.  Patient was seen by neurosurgery and they recommended just repeating CT head  and no intervention.  Eventually patient was stabilized and transferred under hospitalist service on 12/11/2023.  Details below.  Non-aneurysmal SAH. Headache - 2/2 above.  Improved after oxycodone. - Continue Keppra through 11/10 (7 days total). - Continue Tylenol  and Fioricet for headache. - F/u with PCP as scheduled.  Cleared by neurosurgery.   Hx HTN, HLD. - Continue Atorvastatin , but discontinuing aspirin  as she was on 325 mg.  May resume aspirin  in 1 to 2 weeks. - F/u with PCP as scheduled.   Uncontrolled DM - Hgb  A1c 9.5. - DM educator recommending the following regimen on discharge along with close PCP follow up: Novolin NPH 20 units BID if CBGs 200 -300 or 25 units BID if CBGs > 300.  Blood sugar has been around 200 range, I am discharging her on 25 units twice daily.   Nausea with intermittent epigastric pain and possible gastroparesis.  Symptoms improved with Zofran .  Prescribing some Zofran  ODT in case she feels nauseous due to headache.  Headache should improve gradually over the course of next week to 2 weeks. - F/u with PCP as scheduled.   Constipation -did not resolve Aviane after Sorbitol/SMOG enema/Dulcolax suppository 11/7.  Gave her another lactulose and enema today.  She had large bowel movement.   UTI - s/p 3 days CTX. - No further interventions required.  Discharge Diagnoses:  Principal Problem:   Subarachnoid hemorrhage (HCC) Active Problems:   Type 2 diabetes mellitus with hyperglycemia, without long-term current use of insulin  (HCC)   SAH (subarachnoid hemorrhage) (HCC)   Malnutrition of moderate degree   Pressure injury of skin    Discharge Instructions  Discharge Instructions     Ambulatory referral to Physical Therapy   Complete by: As directed       Allergies as of 12/11/2023   No Known Allergies      Medication List     STOP taking these medications    Advil 200 MG tablet Generic drug: ibuprofen   amoxicillin -clavulanate 875-125 MG tablet Commonly known as: AUGMENTIN    aspirin  325 MG tablet   benzonatate  100 MG capsule Commonly known as: TESSALON    doxycycline  100 MG capsule Commonly known as: VIBRAMYCIN    glimepiride  2 MG tablet Commonly known as: AMARYL    guaiFENesin  600 MG 12 hr tablet Commonly known as: Mucinex    nadolol  20 MG tablet Commonly known as: CORGARD    NovoLOG  Mix 70/30 (70-30) 100 UNIT/ML injection Generic drug: insulin  aspart protamine- aspart       TAKE these medications    Accu-Chek Guide test strip Generic drug:  glucose blood Use segn las indicaciones. (Use as instructed)   Accu-Chek Guide w/Device Kit 1 each by Does not apply route 4 (four) times daily.   Accu-Chek Softclix Lancets lancets Use como se indica. (Use as directed.)   atorvastatin  40 MG tablet Commonly known as: LIPITOR Tome 1 tableta (40 mg en total) por va oral diariamente. (Take 1 tablet (40 mg total) by mouth daily.)   insulin  NPH Human 100 UNIT/ML injection Commonly known as: NOVOLIN N Inject 0.25 mLs (25 Units total) into the skin 2 (two) times daily before a meal. What changed:  how much to take when to take this   levETIRAcetam 500 MG tablet Commonly known as: KEPPRA Take 1 tablet (500 mg total) by mouth 2 (two) times daily for 6 doses.   oxyCODONE 5 MG immediate release tablet Commonly known as: Oxy IR/ROXICODONE Take 1 tablet (5 mg total) by mouth every 4 (four)  hours as needed for severe pain (pain score 7-10).   UltiCare Insulin  Syringe 30G X 1/2 0.3 ML Misc Generic drug: Insulin  Syringe-Needle U-100 Use as directed with insulin                Durable Medical Equipment  (From admission, onward)           Start     Ordered   12/10/23 1345  For home use only DME Walker youth  Once       Question:  Patient needs a walker to treat with the following condition  Answer:  Weakness   12/10/23 1344   12/10/23 1229  For home use only DME Walker rolling  Once       Question Answer Comment  Walker: With 5 Inch Wheels   Patient needs a walker to treat with the following condition Weakness      12/10/23 1229            Follow-up Information     Gayle Numbers F, PA-C Follow up.   Specialty: Physician Assistant Why: TIME : 10:00 AM   PLEASE ARRIVE AT 9:30 AM DATE : February 02, 2024 WEDNESDAY--Cancel this appt if attend the one on the 2nd  PLEASE BRING ALL CURRENT MEDICATION, ID and INS CARD APPT FOR NOVEMBER 06,2025 HAS BEEN [facility] HOSPITAL Contact information: [address] [place] [address] [phone]         [facility] Follow up on 01/04/2024.   Why: Your appt is at 2:45 pm. Please arrive early and bring ID and insurance card Contact information: [address] [place]  [address] [phone]        [facility]. Schedule an appointment as soon as possible for a visit.   Specialty: Rehabilitation Contact information: [address] [place]   [address] [phone]               No Known Allergies  Consultations: Neurosurgery and critical care   Procedures/Studies: VAS US  TRANSCRANIAL DOPPLER Result Date: 12/11/2023  Transcranial Doppler Patient Name:  Kimberly Fry  Date of Exam:   12/08/2023 Medical Rec #: [id number]                Accession #:    [id number] Date of Birth: 1966-02-01                Patient Gender: F Patient Age:   50 years Exam Location:  [facility] Procedure:      VAS US  TRANSCRANIAL DOPPLER Referring Phys: FONDA SHARPS --------------------------------------------------------------------------------  Indications: Subarachnoid hemorrhage. Performing Technologist: Ricka Sturdivant-Jones RDMS, RVT  Examination Guidelines: A complete evaluation includes B-mode imaging, spectral Doppler, color Doppler, and power Doppler as needed of all accessible portions of each vessel. Bilateral testing is considered an integral part of a complete examination. Limited examinations for reoccurring indications may be performed as noted.  +----------+---------------+----------+-----------+-------------+ RIGHT TCD Right VM (cm/s)Depth (cm)Pulsatility   Comment    +----------+---------------+----------+-----------+-------------+ MCA                                           not insonated +----------+---------------+----------+-----------+-------------+ ACA  not insonated +----------+---------------+----------+-----------+-------------+ Term ICA                                      not insonated +----------+---------------+----------+-----------+-------------+ PCA P1          25                    0.91                  +----------+---------------+----------+-----------+-------------+ Opthalmic       21                    1.34                  +----------+---------------+----------+-----------+-------------+ ICA siphon                                    not insonated +----------+---------------+----------+-----------+-------------+ Vertebral       -34                   0.97                  +----------+---------------+----------+-----------+-------------+ Distal ICA      22                                          +----------+---------------+----------+-----------+-------------+  +----------+--------------+----------+-----------+-------------+ LEFT TCD  Left VM (cm/s)Depth (cm)Pulsatility   Comment    +----------+--------------+----------+-----------+-------------+ MCA             43                   1.00                  +----------+--------------+----------+-----------+-------------+ ACA            -29                   0.93                  +----------+--------------+----------+-----------+-------------+ Term ICA        29                   1.15                  +----------+--------------+----------+-----------+-------------+ PCA P1          21                   0.88                  +----------+--------------+----------+-----------+-------------+ Opthalmic       19                   1.54                  +----------+--------------+----------+-----------+-------------+ ICA siphon                                   not insonated +----------+--------------+----------+-----------+-------------+ Vertebral      -37                   0.97                   +----------+--------------+----------+-----------+-------------+  Distal ICA      37                                         +----------+--------------+----------+-----------+-------------+  +------------+-------+-------+             VM cm/sComment +------------+-------+-------+ Prox Basilar  -37          +------------+-------+-------+ Dist Basilar  -56          +------------+-------+-------+ +---------------------+---+ Left Lindegaard Ratio1.1 +---------------------+---+  Summary:  Poor right temporal window limits evaluation but normal mean flow velocities in remaining identified vessels of anterior and posterior cerebral circulation without evidence of vasospasm. *See table(s) above for TCD measurements and observations.  Diagnosing physician: Eather Popp MD Electronically signed by Eather Popp MD on 12/11/2023 at 11:36:08 AM.    Final    CT HEAD WO CONTRAST ( ) Result Date: 12/11/2023 EXAM: CT HEAD WITHOUT CONTRAST 12/11/2023 09:51:24 AM TECHNIQUE: CT of the head was performed without the administration of intravenous contrast. Automated exposure control, iterative reconstruction, and/or weight based adjustment of the mA/kV was utilized to reduce the radiation dose to as low as reasonably achievable. COMPARISON: CTA head and neck 12/09/2023 and 12/04/2023. Head CT 12/06/2023 and earlier. CLINICAL HISTORY: 58 year old female with acute subarachnoid hemorrhage on 12/04/2023. FINDINGS: BRAIN AND VENTRICLES: Hyperdense subarachnoid hemorrhage has substantially faded from the suprasellar cistern, prepontine and interpeduncular cisterns, and In the right sylvian fissure. A small volume of low to intermediate density blood products persists. No intraventricular blood or ventriculomegaly. Stable brain volume with no intracranial mass effect. No midline shift. No evidence of acute infarct. Gray white differentiation stable and within normal limits. No suspicious intracranial vascular  hyperdensity. ORBITS: No acute abnormality. SINUSES: Paranasal sinuses, middle ears and mastoids remain well aerated. SOFT TISSUES AND SKULL: No acute soft tissue abnormality. No skull fracture. IMPRESSION: 1. Recent basilar cistern predominant subarachnoid hemorrhage is fading. 2. No new intracranial abnormality. Electronically signed by: Helayne Hurst MD 12/11/2023 10:00 AM EST RP Workstation: [id number]   CT ANGIO HEAD NECK W WO CM Result Date: 12/09/2023 EXAM: CTA HEAD AND NECK WITHOUT AND WITH 12/09/2023 12:17:08 PM TECHNIQUE: CTA of the head and neck was performed without and with the administration of 75 mL of iohexol  (OMNIPAQUE ) 350 MG/ML injection. Multiplanar 2D and/or 3D reformatted images are provided for review. Automated exposure control, iterative reconstruction, and/or weight based adjustment of the mA/kV was utilized to reduce the radiation dose to as low as reasonably achievable. Stenosis of the internal carotid arteries measured using NASCET criteria. COMPARISON: None available CLINICAL HISTORY: Neuro deficit, acute, stroke suspected. Follow up subarachnoid hemorrhage. FINDINGS: CTA NECK: AORTIC ARCH AND ARCH VESSELS: Minimal atherosclerotic changes are present in the distal aortic arch. No aneurysm or stenosis is present. CERVICAL CAROTID ARTERIES: Minimal calcifications are present along the wall of the proximal right ICA without significant distal stenosis. The left ICA is patent without significant stenosis or dissection. No other dissection or arterial injury is identified in the cervical carotid arteries. No hemodynamically significant stenosis by NASCET criteria. CERVICAL VERTEBRAL ARTERIES: No dissection, arterial injury, or significant stenosis. LUNGS AND MEDIASTINUM: Unremarkable. SOFT TISSUES: No acute abnormality. BONES: No acute abnormality. CTA HEAD: ANTERIOR CIRCULATION: No significant stenosis of the intracranial internal carotid arteries. No significant stenosis of the anterior  cerebral arteries. No significant stenosis of the middle cerebral arteries. No aneurysm. POSTERIOR CIRCULATION: No significant stenosis of the  posterior cerebral arteries. No significant stenosis of the basilar artery. No significant stenosis of the vertebral arteries. No aneurysm. OTHER: No dural venous sinus thrombosis on this non-dedicated study. IMPRESSION: 1. No large vessel occlusion, hemodynamically significant stenosis, or aneurysm in the head or neck. Electronically signed by: Lonni Necessary MD 12/09/2023 12:27 PM EST RP Workstation: [id number]   VAS US  TRANSCRANIAL DOPPLER Result Date: 12/07/2023  Transcranial Doppler Patient Name:  Liesa JESUS Fry Fry  Date of Exam:   12/06/2023 Medical Rec #: [id number]                Accession #:    [id number] Date of Birth: 01-03-1966                Patient Gender: F Patient Age:   59 years Exam Location:  [facility] Procedure:      VAS US  TRANSCRANIAL DOPPLER Referring Phys: FONDA SHARPS --------------------------------------------------------------------------------  Indications: Subarachnoid hemorrhage. Limitations for diagnostic windows: Unable to insonate right transtemporal window. Unable to insonate left transtemporal window. Comparison Study: No previous exams Performing Technologist: Jody Hill RVT, RDMS  Examination Guidelines: A complete evaluation includes B-mode imaging, spectral Doppler, color Doppler, and power Doppler as needed of all accessible portions of each vessel. Bilateral testing is considered an integral part of a complete examination. Limited examinations for reoccurring indications may be performed as noted.  +----------+---------------+----------+-----------+------------------+ RIGHT TCD Right VM (cm/s)Depth (cm)Pulsatility     Comment       +----------+---------------+----------+-----------+------------------+ MCA                                           unable to insonate  +----------+---------------+----------+-----------+------------------+ ACA                                           unable to insonate +----------+---------------+----------+-----------+------------------+ Term ICA                                      unable to insonate +----------+---------------+----------+-----------+------------------+ PCA P1                                        unable to insonate +----------+---------------+----------+-----------+------------------+ Opthalmic       17                    1.48                       +----------+---------------+----------+-----------+------------------+ ICA siphon                                    unable to insonate +----------+---------------+----------+-----------+------------------+ Vertebral       -40                   1.17                       +----------+---------------+----------+-----------+------------------+  +----------+--------------+----------+-----------+------------------+ LEFT TCD  Left VM (cm/s)Depth (cm)Pulsatility     Comment       +----------+--------------+----------+-----------+------------------+  MCA                                          unable to insonate +----------+--------------+----------+-----------+------------------+ ACA                                          unable to insonate +----------+--------------+----------+-----------+------------------+ Term ICA                                     unable to insonate +----------+--------------+----------+-----------+------------------+ PCA P1                                       unable to insonate +----------+--------------+----------+-----------+------------------+ Opthalmic       16                   1.23                       +----------+--------------+----------+-----------+------------------+ ICA siphon                                   unable to insonate  +----------+--------------+----------+-----------+------------------+ Vertebral      -33                   1.10                       +----------+--------------+----------+-----------+------------------+  +------------+-------+-------+             VM cm/sComment +------------+-------+-------+ Prox Basilar  -39   1.28   +------------+-------+-------+ Dist Basilar  -42   1.08   +------------+-------+-------+ Summary:  Absent bitempora and poor orbital windows due to poor acoustic windows limit exam..Normal mean flow velocities in both opthalmics, bilateral vertebrals and basilar artery without definite evidence of vasospasm noted. Diffusedly elevated pulsatility indices suggest increased intracranial pressure likely *See table(s) above for TCD measurements and observations.  Diagnosing physician: Eather Popp MD Electronically signed by Eather Popp MD on 12/07/2023 at 11:36:26 AM.    Final    CT HEAD WO CONTRAST ( ) Result Date: 12/06/2023 EXAM: CT HEAD WITHOUT CONTRAST 12/06/2023 02:07:59 PM TECHNIQUE: CT of the head was performed without the administration of intravenous contrast. Automated exposure control, iterative reconstruction, and/or weight based adjustment of the mA/kV was utilized to reduce the radiation dose to as low as reasonably achievable. COMPARISON: CT head 12/04/2023 CLINICAL HISTORY: f/u SAH FINDINGS: BRAIN AND VENTRICLES: Mildly decreased volume of subarachnoid hemorrhage in similar distribution. Unchanged ventricular size. No evidence of acute infarct. No mass effect or midline shift. ORBITS: No acute abnormality. SINUSES: No acute abnormality. SOFT TISSUES AND SKULL: No acute soft tissue abnormality. No skull fracture. IMPRESSION: 1. Mildly decreased volume of subarachnoid hemorrhage in similar distribution. 2. Unchanged ventricular size. Electronically signed by: Gilmore Molt MD 12/06/2023 02:38 PM EST RP Workstation: [id number]   ECHOCARDIOGRAM COMPLETE Result  Date: 12/05/2023    ECHOCARDIOGRAM REPORT   Patient Name:   [facility] Kelvin Kelvin Date of Exam: 12/05/2023 Medical Rec #:  [id number]  Height:       64.0 in Accession #:    [id number]              Weight:       108.2 lb Date of Birth:  Dec 03, 1965               BSA:          1.507 m Patient Age:    58 years                BP:           122/69 mmHg Patient Gender: F                       HR:           69 bpm. Exam Location:  Inpatient Procedure: 2D Echo, Cardiac Doppler and Color Doppler (Both Spectral and Color            Flow Doppler were utilized during procedure). Indications:    Chest Pain R07.9  History:        Patient has prior history of Echocardiogram examinations, most                 recent 07/26/2020. Signs/Symptoms:Chest Pain and Syncope; Risk                 Factors:Diabetes.  Sonographer:    Thea Norlander RCS Referring Phys: FONDA JAYSON SHARPS IMPRESSIONS  1. Left ventricular ejection fraction, by estimation, is 60 to 65%. The left ventricle has normal function. The left ventricle has no regional wall motion abnormalities. Left ventricular diastolic parameters are consistent with Grade I diastolic dysfunction (impaired relaxation).  2. Right ventricular systolic function is normal. The right ventricular size is normal.  3. The mitral valve is normal in structure. Trivial mitral valve regurgitation. No evidence of mitral stenosis.  4. The aortic valve is tricuspid. Aortic valve regurgitation is not visualized. No aortic stenosis is present.  5. The inferior vena cava is normal in size with greater than 50% respiratory variability, suggesting right atrial pressure of 3 mmHg. Comparison(s): No significant change from prior study. FINDINGS  Left Ventricle: Left ventricular ejection fraction, by estimation, is 60 to 65%. The left ventricle has normal function. The left ventricle has no regional wall motion abnormalities. Strain was performed and the global longitudinal strain is indeterminate. The  left ventricular internal cavity size was normal in size. There is no left ventricular hypertrophy. Left ventricular diastolic parameters are consistent with Grade I diastolic dysfunction (impaired relaxation). Normal left ventricular filling pressure. Right Ventricle: The right ventricular size is normal. No increase in right ventricular wall thickness. Right ventricular systolic function is normal. Left Atrium: Left atrial size was normal in size. Right Atrium: Right atrial size was normal in size. Pericardium: There is no evidence of pericardial effusion. Mitral Valve: The mitral valve is normal in structure. Trivial mitral valve regurgitation. No evidence of mitral valve stenosis. Tricuspid Valve: The tricuspid valve is normal in structure. Tricuspid valve regurgitation is trivial. No evidence of tricuspid stenosis. Aortic Valve: The aortic valve is tricuspid. Aortic valve regurgitation is not visualized. No aortic stenosis is present. Aortic valve peak gradient measures 9.2 mmHg. Pulmonic Valve: The pulmonic valve was not well visualized. Pulmonic valve regurgitation is not visualized. No evidence of pulmonic stenosis. Aorta: The aortic root and ascending aorta are structurally normal, with no evidence of dilitation. Venous: The inferior vena cava is normal in size  with greater than 50% respiratory variability, suggesting right atrial pressure of 3 mmHg. IAS/Shunts: No atrial level shunt detected by color flow Doppler. Additional Comments: 3D was performed not requiring image post processing on an independent workstation and was indeterminate.  LEFT VENTRICLE PLAX 2D LVIDd:         3.40 cm   Diastology LVIDs:         2.00 cm   LV e' medial:    8.92 cm/s LV PW:         0.90 cm   LV E/e' medial:  8.5 LV IVS:        0.80 cm   LV e' lateral:   11.50 cm/s LVOT diam:     1.70 cm   LV E/e' lateral: 6.6 LV SV:         48 LV SV Index:   32 LVOT Area:     2.27 cm  RIGHT VENTRICLE             IVC RV S prime:     15.00  cm/s  IVC diam: 1.10 cm TAPSE (M-mode): 2.1 cm LEFT ATRIUM             Index        RIGHT ATRIUM           Index LA diam:        3.30 cm 2.19 cm/m   RA Area:     10.40 cm LA Vol (A2C):   34.3 ml 22.76 ml/m  RA Volume:   18.00 ml  11.94 ml/m LA Vol (A4C):   36.2 ml 24.02 ml/m LA Biplane Vol: 36.9 ml 24.48 ml/m  AORTIC VALVE AV Area (Vmax): 1.57 cm AV Vmax:        152.00 cm/s AV Peak Grad:   9.2 mmHg LVOT Vmax:      105.00 cm/s LVOT Vmean:     68.100 cm/s LVOT VTI:       0.213 m  AORTA Ao Root diam: 3.10 cm Ao Asc diam:  3.00 cm MITRAL VALVE               TRICUSPID VALVE MV Area (PHT): 3.37 cm    TR Peak grad:   213444.0 mmHg MV Decel Time: 225 msec    TR Vmax:        23100.00 cm/s MV E velocity: 75.50 cm/s MV A velocity: 87.50 cm/s  SHUNTS MV E/A ratio:  0.86        Systemic VTI:  0.21 m                            Systemic Diam: 1.70 cm Vishnu Priya Mallipeddi Electronically signed by Diannah Late Mallipeddi Signature Date/Time: 12/05/2023/4:41:27 PM    Final    CT ANGIO HEAD W OR WO CONTRAST Result Date: 12/04/2023 EXAM: CTA Head without and with Intravenous Contrast CLINICAL HISTORY: TECHNIQUE: Axial CTA images of the head without and with intravenous contrast. MIP reconstructed images were created and reviewed. Dose reduction technique was used including one or more of the following: automated exposure control, adjustment of mA and kV according to patient size, and/or iterative reconstruction. CONTRAST: Without and with; 75 mL iohexol  (OMNIPAQUE ) 350 MG/ML injection 75 mL IOHEXOL  350 MG/ML SOLN. COMPARISON: CT performed earlier the same day as well as prior MRI from 07/25/2020. FINDINGS: INTERNAL CAROTID ARTERIES: Minimal atherosclerotic change about the intracranial siphons without stenosis or other abnormality. No occlusion. No  aneurysm. ANTERIOR CEREBRAL ARTERIES: Right A1 segment dominant and widely patent. Left A1 hypoplastic and grossly patent as well. Normal anterior communicating artery complex.  Both ACAs patent without stenosis. No A1 stenosis or occlusion. Distal anterior branches perfused and symmetric. No aneurysm. MIDDLE CEREBRAL ARTERIES: Normal MCA bifurcations. No significant stenosis. No occlusion. No aneurysm. POSTERIOR CEREBRAL ARTERIES: Both PCAs are primarily supplied via the basilar artery. PCAs are patent in their distal aspects without stenosis. No aneurysm. BASILAR ARTERY: Basilar artery patent without stenosis. No occlusion. No aneurysm. VERTEBRAL ARTERIES: Both vertebral arteries patent without stenosis. Right vertebral artery dominant. Both PICAs patent. No occlusion. No aneurysm. SOFT TISSUES: No acute finding. No masses or lymphadenopathy. BONES: No acute osseous abnormality. Superior cerebellar arteries patent bilaterally. No vascular beading or irregularity. No intracranial aneurysm or other vascular malformation identified to explain the acute event. IMPRESSION: 1. Stable and normal CTA of the head. No intracranial aneurysm or other vascular abnormality identified to explain the acute subarachnoid hemorrhage. 2. No large vessel occlusion. No hemodynamically significant or correctable stenosis. Electronically signed by: Morene Hoard MD 12/04/2023 11:49 PM EDT RP Workstation: [id number]   CT Angio Chest/Abd/Pel for Dissection W and/or Wo Contrast Result Date: 12/04/2023 EXAM: CTA CHEST, ABDOMEN AND PELVIS WITHOUT AND WITH CONTRAST 12/04/2023 09:05:13 PM TECHNIQUE: CTA of the chest was performed without and with the administration of 100 mL of iohexol  (OMNIPAQUE ) 350 MG/ML intravenous contrast. CTA of the abdomen and pelvis was performed without and with the administration of 100 mL of iohexol  (OMNIPAQUE ) 350 MG/ML intravenous contrast. Multiplanar reformatted images are provided for review. MIP images are provided for review. Automated exposure control, iterative reconstruction, and/or weight based adjustment of the mA/kV was utilized to reduce the radiation dose to as low  as reasonably achievable. COMPARISON: 07/25/2020 CLINICAL HISTORY: Acute aortic syndrome (AAS) suspected; CP/Abd pain, Hypertensive. FINDINGS: VASCULATURE: AORTA: No acute finding. No abdominal aortic aneurysm. No dissection. PULMONARY ARTERIES: No pulmonary embolism with the limits of this exam. GREAT VESSELS OF AORTIC ARCH: No acute finding. No dissection. No arterial occlusion or significant stenosis. CELIAC TRUNK: No acute finding. No occlusion or significant stenosis. SUPERIOR MESENTERIC ARTERY: No acute finding. No occlusion or significant stenosis. INFERIOR MESENTERIC ARTERY: No acute finding. No occlusion or significant stenosis. RENAL ARTERIES: No acute finding. No occlusion or significant stenosis. ILIAC ARTERIES: No acute finding. No occlusion or significant stenosis. CHEST: MEDIASTINUM: No mediastinal lymphadenopathy. The heart and pericardium demonstrate no acute abnormality. LUNGS AND PLEURA: The lungs are without acute process. No focal consolidation or pulmonary edema. No evidence of pleural effusion or pneumothorax. THORACIC BONES AND SOFT TISSUES: No acute bone or soft tissue abnormality. ABDOMEN AND PELVIS: LIVER: The liver is unremarkable. GALLBLADDER AND BILE DUCTS: Gallbladder is unremarkable. No biliary ductal dilatation. SPLEEN: The spleen is unremarkable. PANCREAS: The pancreas is unremarkable. ADRENAL GLANDS: Bilateral adrenal glands demonstrate no acute abnormality. KIDNEYS, URETERS AND BLADDER: No stones in the kidneys or ureters. No hydronephrosis. No perinephric or periureteral stranding. Urinary bladder is unremarkable. GI AND BOWEL: Stomach and duodenal sweep demonstrate no acute abnormality. There is no bowel obstruction. No abnormal bowel wall thickening or distension. Large stool burden throughout the colon. REPRODUCTIVE: Reproductive organs are unremarkable. PERITONEUM AND RETROPERITONEUM: No ascites or free air. LYMPH NODES: No lymphadenopathy. ABDOMINAL BONES AND SOFT TISSUES:  No acute abnormality of the bones. No acute soft tissue abnormality. IMPRESSION: 1. No evidence of acute aortic syndrome. 2. Large stool burden throughout the colon. Electronically signed by: Franky Crease MD 12/04/2023 09:18  PM EDT RP Workstation: [id number]   CT Head Wo Contrast Result Date: 12/04/2023 EXAM: CT HEAD WITHOUT CONTRAST 12/04/2023 09:05:13 PM TECHNIQUE: CT of the head was performed without the administration of intravenous contrast. Automated exposure control, iterative reconstruction, and/or weight based adjustment of the mA/kV was utilized to reduce the radiation dose to as low as reasonably achievable. COMPARISON: MRI 07/25/2020 CLINICAL HISTORY: Headache, increasing frequency or severity. FINDINGS: BRAIN AND VENTRICLES: Subarachnoid blood noted in the right sylvian fissure and basilar cisterns most pronounced on the right. No intraparenchymal hemorrhage. No acute infarct. No hydrocephalus. No mass effect or midline shift. ORBITS: No acute abnormality. SINUSES: No acute abnormality. SOFT TISSUES AND SKULL: No acute soft tissue abnormality. No skull fracture. IMPRESSION: 1. Subarachnoid hemorrhage centered in the right sylvian fissure and basilar cisterns, greatest on the right. 2. No intraparenchymal hemorrhage, hydrocephalus, or acute infarct. 3. Results were called to Dr. Ula at the time of interpretation. Electronically signed by: Franky Crease MD 12/04/2023 09:14 PM EDT RP Workstation: [id number]     Discharge Exam: Vitals:   12/11/23 0815 12/11/23 1116  BP: 132/83 104/67  Pulse: 73   Resp: 18 18  Temp: 98.2 F (36.8 C) 98.2 F (36.8 C)  SpO2: 98% 98%   Vitals:   12/10/23 2315 12/11/23 0359 12/11/23 0815 12/11/23 1116  BP: (!) 141/74 104/70 132/83 104/67  Pulse: 70 63 73   Resp: 18 18 18 18   Temp: 99.3 F (37.4 C) 99 F (37.2 C) 98.2 F (36.8 C) 98.2 F (36.8 C)  TempSrc: Oral  Oral   SpO2: 95% 95% 98% 98%  Weight:      Height:        General: Pt is alert, awake,  not in acute distress Cardiovascular: RRR, S1/S2 +, no rubs, no gallops Respiratory: CTA bilaterally, no wheezing, no rhonchi Abdominal: Soft, NT, ND, bowel sounds + Extremities: no edema, no cyanosis    The results of significant diagnostics from this hospitalization (including imaging, microbiology, ancillary and laboratory) are listed below for reference.     Microbiology: Recent Results (from the past 240 hours)  Resp panel by RT-PCR (RSV, Flu A&B, Covid) Anterior Nasal Swab     Status: None   Collection Time: 12/04/23  5:12 PM   Specimen: Anterior Nasal Swab  Result Value Ref Range Status   SARS Coronavirus 2 by RT PCR NEGATIVE NEGATIVE Final    Comment: (NOTE) SARS-CoV-2 target nucleic acids are NOT DETECTED.  The SARS-CoV-2 RNA is generally detectable in upper respiratory specimens during the acute phase of infection. The lowest concentration of SARS-CoV-2 viral copies this assay can detect is 138 copies/mL. A negative result does not preclude SARS-Cov-2 infection and should not be used as the sole basis for treatment or other patient management decisions. A negative result may occur with  improper specimen collection/handling, submission of specimen other than nasopharyngeal swab, presence of viral mutation(s) within the areas targeted by this assay, and inadequate number of viral copies(<138 copies/mL). A negative result must be combined with clinical observations, patient history, and epidemiological information. The expected result is Negative.  Fact Sheet for Patients:  bloggercourse.com  Fact Sheet for Healthcare Providers:  seriousbroker.it  This test is no t yet approved or cleared by the [place]  FDA and  has been authorized for detection and/or diagnosis of SARS-CoV-2 by FDA under an Emergency Use Authorization (EUA). This EUA will remain  in effect (meaning this test can be used) for the duration of  the COVID-19  declaration under Section 564(b)(1) of the Act, 21 U.S.C.section 360bbb-3(b)(1), unless the authorization is terminated  or revoked sooner.       Influenza A by PCR NEGATIVE NEGATIVE Final   Influenza B by PCR NEGATIVE NEGATIVE Final    Comment: (NOTE) The Xpert Xpress SARS-CoV-2/FLU/RSV plus assay is intended as an aid in the diagnosis of influenza from Nasopharyngeal swab specimens and should not be used as a sole basis for treatment. Nasal washings and aspirates are unacceptable for Xpert Xpress SARS-CoV-2/FLU/RSV testing.  Fact Sheet for Patients: bloggercourse.com  Fact Sheet for Healthcare Providers: seriousbroker.it  This test is not yet approved or cleared by the [place]  FDA and has been authorized for detection and/or diagnosis of SARS-CoV-2 by FDA under an Emergency Use Authorization (EUA). This EUA will remain in effect (meaning this test can be used) for the duration of the COVID-19 declaration under Section 564(b)(1) of the Act, 21 U.S.C. section 360bbb-3(b)(1), unless the authorization is terminated or revoked.     Resp Syncytial Virus by PCR NEGATIVE NEGATIVE Final    Comment: (NOTE) Fact Sheet for Patients: bloggercourse.com  Fact Sheet for Healthcare Providers: seriousbroker.it  This test is not yet approved or cleared by the [place]  FDA and has been authorized for detection and/or diagnosis of SARS-CoV-2 by FDA under an Emergency Use Authorization (EUA). This EUA will remain in effect (meaning this test can be used) for the duration of the COVID-19 declaration under Section 564(b)(1) of the Act, 21 U.S.C. section 360bbb-3(b)(1), unless the authorization is terminated or revoked.  Performed at [facility], [address], [place], [place] [address]   MRSA Next Gen by PCR, Nasal     Status: None   Collection  Time: 12/05/23  6:14 AM   Specimen: Nasal Mucosa; Nasal Swab  Result Value Ref Range Status   MRSA by PCR Next Gen NOT DETECTED NOT DETECTED Final    Comment: (NOTE) The GeneXpert MRSA Assay (FDA approved for NASAL specimens only), is one component of a comprehensive MRSA colonization surveillance program. It is not intended to diagnose MRSA infection nor to guide or monitor treatment for MRSA infections. Test performance is not FDA approved in patients less than 75 years old. Performed at [facility], [address], [place], [place] [address]   Urine Culture (for pregnant, neutropenic or urologic patients or patients with an indwelling urinary catheter)     Status: Abnormal   Collection Time: 12/05/23  2:57 PM   Specimen: Urine, Clean Catch  Result Value Ref Range Status   Specimen Description URINE, CLEAN CATCH  Final   Special Requests   Final    ADDED 2234 12/05/2023 Performed at [facility], [address], [place], [place] [address]    Culture >=100,000 COLONIES/mL ESCHERICHIA COLI (A)  Final   Report Status 12/07/2023 FINAL  Final   Organism ID, Bacteria ESCHERICHIA COLI (A)  Final      Susceptibility   Escherichia coli - MIC*    AMPICILLIN >=32 RESISTANT Resistant     CEFAZOLIN (URINE) Value in next row Sensitive      4 SENSITIVEThis is a modified FDA-approved test that has been validated and its performance characteristics determined by the reporting laboratory.  This laboratory is certified under the Clinical Laboratory Improvement Amendments CLIA as qualified to perform high complexity clinical laboratory testing.    CEFEPIME Value in next row Sensitive      4 SENSITIVEThis is a modified FDA-approved test  that has been validated and its performance characteristics determined by the reporting laboratory.  This laboratory is certified under the Clinical Laboratory Improvement Amendments CLIA as qualified to perform high complexity clinical laboratory testing.     ERTAPENEM Value in next row Sensitive      4 SENSITIVEThis is a modified FDA-approved test that has been validated and its performance characteristics determined by the reporting laboratory.  This laboratory is certified under the Clinical Laboratory Improvement Amendments CLIA as qualified to perform high complexity clinical laboratory testing.    CEFTRIAXONE Value in next row Sensitive      4 SENSITIVEThis is a modified FDA-approved test that has been validated and its performance characteristics determined by the reporting laboratory.  This laboratory is certified under the Clinical Laboratory Improvement Amendments CLIA as qualified to perform high complexity clinical laboratory testing.    CIPROFLOXACIN Value in next row Sensitive      4 SENSITIVEThis is a modified FDA-approved test that has been validated and its performance characteristics determined by the reporting laboratory.  This laboratory is certified under the Clinical Laboratory Improvement Amendments CLIA as qualified to perform high complexity clinical laboratory testing.    GENTAMICIN Value in next row Sensitive      4 SENSITIVEThis is a modified FDA-approved test that has been validated and its performance characteristics determined by the reporting laboratory.  This laboratory is certified under the Clinical Laboratory Improvement Amendments CLIA as qualified to perform high complexity clinical laboratory testing.    NITROFURANTOIN Value in next row Sensitive      4 SENSITIVEThis is a modified FDA-approved test that has been validated and its performance characteristics determined by the reporting laboratory.  This laboratory is certified under the Clinical Laboratory Improvement Amendments CLIA as qualified to perform high complexity clinical laboratory testing.    TRIMETH/SULFA Value in next row Sensitive      4 SENSITIVEThis is a modified FDA-approved test that has been validated and its performance characteristics determined by  the reporting laboratory.  This laboratory is certified under the Clinical Laboratory Improvement Amendments CLIA as qualified to perform high complexity clinical laboratory testing.    AMPICILLIN/SULBACTAM Value in next row Intermediate      4 SENSITIVEThis is a modified FDA-approved test that has been validated and its performance characteristics determined by the reporting laboratory.  This laboratory is certified under the Clinical Laboratory Improvement Amendments CLIA as qualified to perform high complexity clinical laboratory testing.    PIP/TAZO Value in next row Sensitive      <=4 SENSITIVEThis is a modified FDA-approved test that has been validated and its performance characteristics determined by the reporting laboratory.  This laboratory is certified under the Clinical Laboratory Improvement Amendments CLIA as qualified to perform high complexity clinical laboratory testing.    MEROPENEM Value in next row Sensitive      <=4 SENSITIVEThis is a modified FDA-approved test that has been validated and its performance characteristics determined by the reporting laboratory.  This laboratory is certified under the Clinical Laboratory Improvement Amendments CLIA as qualified to perform high complexity clinical laboratory testing.    * >=100,000 COLONIES/mL ESCHERICHIA COLI     Labs: BNP (last 3 results) No results for input(s): BNP in the last 8760 hours. Basic Metabolic Panel: Recent Labs  Lab 12/05/23 0928 12/06/23 0554 12/07/23 0327 12/08/23 0230 12/09/23 0239 12/09/23 0240 12/10/23 0155 12/10/23 1727 12/11/23 0332 12/11/23 1044  NA  --  142 140 137  --  134*  --   --   --  138  K  --  2.9* 3.8 3.8  --  4.0  --   --   --  3.5  CL  --  108 107 102  --  100  --   --   --  103  CO2  --  22 23 23   --  20*  --   --   --  24  GLUCOSE  --  142* 199* 261*  --  340*  --  460*  --  96  BUN  --  7 6 7   --  12  --   --   --  17  CREATININE  --  0.57 0.50 0.63  --  0.70  --   --   --  0.54   CALCIUM   --  8.0* 8.6* 8.7*  --  8.5*  --   --   --  8.7*  MG 1.8 1.9 2.2  --  2.0  --  1.9  --  1.8  --   PHOS 2.8  --   --   --   --  3.0 3.0  --  3.0  --    Liver Function Tests: Recent Labs  Lab 12/04/23 1654 12/09/23 0240  AST 26  --   ALT 22  --   ALKPHOS 125  --   BILITOT 0.4  --   PROT 8.0  --   ALBUMIN 4.2 3.0*   Recent Labs  Lab 12/04/23 1654  LIPASE 43   No results for input(s): AMMONIA in the last 168 hours. CBC: Recent Labs  Lab 12/05/23 0928 12/06/23 0554 12/07/23 0327 12/08/23 0230 12/10/23 0155  WBC 7.2 6.4 7.3 9.1 10.1  HGB 12.8 11.8* 11.9* 12.1 12.2  HCT 39.6 34.9* 35.1* 35.9* 35.5*  MCV 84.3 80.8 80.7 81.0 80.7  PLT 192 184 192 211 234   Cardiac Enzymes: No results for input(s): CKTOTAL, CKMB, CKMBINDEX, TROPONINI in the last 168 hours. BNP: Invalid input(s): POCBNP CBG: Recent Labs  Lab 12/10/23 2008 12/10/23 2316 12/11/23 0358 12/11/23 0632 12/11/23 1153  GLUCAP 387* 296* 200* 166* 166*   D-Dimer No results for input(s): DDIMER in the last 72 hours. Hgb A1c No results for input(s): HGBA1C in the last 72 hours. Lipid Profile No results for input(s): CHOL, HDL, LDLCALC, TRIG, CHOLHDL, LDLDIRECT in the last 72 hours. Thyroid function studies No results for input(s): TSH, T4TOTAL, T3FREE, THYROIDAB in the last 72 hours.  Invalid input(s): FREET3 Anemia work up No results for input(s): VITAMINB12, FOLATE, FERRITIN, TIBC, IRON, RETICCTPCT in the last 72 hours. Urinalysis    Component Value Date/Time   COLORURINE YELLOW 12/05/2023 1457   APPEARANCEUR TURBID (A) 12/05/2023 1457   LABSPEC 1.014 12/05/2023 1457   PHURINE 5.0 12/05/2023 1457   GLUCOSEU 50 (A) 12/05/2023 1457   HGBUR SMALL (A) 12/05/2023 1457   BILIRUBINUR NEGATIVE 12/05/2023 1457   KETONESUR 5 (A) 12/05/2023 1457   PROTEINUR 30 (A) 12/05/2023 1457   NITRITE NEGATIVE 12/05/2023 1457   LEUKOCYTESUR LARGE (A)  12/05/2023 1457   Sepsis Labs Recent Labs  Lab 12/06/23 0554 12/07/23 0327 12/08/23 0230 12/10/23 0155  WBC 6.4 7.3 9.1 10.1   Microbiology Recent Results (from the past 240 hours)  Resp panel by RT-PCR (RSV, Flu A&B, Covid) Anterior Nasal Swab     Status: None   Collection Time: 12/04/23  5:12 PM   Specimen: Anterior Nasal Swab  Result Value Ref Range Status   SARS Coronavirus 2 by RT PCR NEGATIVE  NEGATIVE Final    Comment: (NOTE) SARS-CoV-2 target nucleic acids are NOT DETECTED.  The SARS-CoV-2 RNA is generally detectable in upper respiratory specimens during the acute phase of infection. The lowest concentration of SARS-CoV-2 viral copies this assay can detect is 138 copies/mL. A negative result does not preclude SARS-Cov-2 infection and should not be used as the sole basis for treatment or other patient management decisions. A negative result may occur with  improper specimen collection/handling, submission of specimen other than nasopharyngeal swab, presence of viral mutation(s) within the areas targeted by this assay, and inadequate number of viral copies(<138 copies/mL). A negative result must be combined with clinical observations, patient history, and epidemiological information. The expected result is Negative.  Fact Sheet for Patients:  bloggercourse.com  Fact Sheet for Healthcare Providers:  seriousbroker.it  This test is no t yet approved or cleared by the [place]  FDA and  has been authorized for detection and/or diagnosis of SARS-CoV-2 by FDA under an Emergency Use Authorization (EUA). This EUA will remain  in effect (meaning this test can be used) for the duration of the COVID-19 declaration under Section 564(b)(1) of the Act, 21 U.S.C.section 360bbb-3(b)(1), unless the authorization is terminated  or revoked sooner.       Influenza A by PCR NEGATIVE NEGATIVE Final   Influenza B by PCR NEGATIVE  NEGATIVE Final    Comment: (NOTE) The Xpert Xpress SARS-CoV-2/FLU/RSV plus assay is intended as an aid in the diagnosis of influenza from Nasopharyngeal swab specimens and should not be used as a sole basis for treatment. Nasal washings and aspirates are unacceptable for Xpert Xpress SARS-CoV-2/FLU/RSV testing.  Fact Sheet for Patients: bloggercourse.com  Fact Sheet for Healthcare Providers: seriousbroker.it  This test is not yet approved or cleared by the [place]  FDA and has been authorized for detection and/or diagnosis of SARS-CoV-2 by FDA under an Emergency Use Authorization (EUA). This EUA will remain in effect (meaning this test can be used) for the duration of the COVID-19 declaration under Section 564(b)(1) of the Act, 21 U.S.C. section 360bbb-3(b)(1), unless the authorization is terminated or revoked.     Resp Syncytial Virus by PCR NEGATIVE NEGATIVE Final    Comment: (NOTE) Fact Sheet for Patients: bloggercourse.com  Fact Sheet for Healthcare Providers: seriousbroker.it  This test is not yet approved or cleared by the [place]  FDA and has been authorized for detection and/or diagnosis of SARS-CoV-2 by FDA under an Emergency Use Authorization (EUA). This EUA will remain in effect (meaning this test can be used) for the duration of the COVID-19 declaration under Section 564(b)(1) of the Act, 21 U.S.C. section 360bbb-3(b)(1), unless the authorization is terminated or revoked.  Performed at [facility], [address] [place], [place] [address]   MRSA Next Gen by PCR, Nasal     Status: None   Collection Time: 12/05/23  6:14 AM   Specimen: Nasal Mucosa; Nasal Swab  Result Value Ref Range Status   MRSA by PCR Next Gen NOT DETECTED NOT DETECTED Final    Comment: (NOTE) The GeneXpert MRSA Assay (FDA approved for NASAL specimens only), is  one component of a comprehensive MRSA colonization surveillance program. It is not intended to diagnose MRSA infection nor to guide or monitor treatment for MRSA infections. Test performance is not FDA approved in patients less than 41 years old. Performed at [facility], [address] [place], [place] [address]   Urine Culture (for pregnant, neutropenic or urologic patients or patients  with an indwelling urinary catheter)     Status: Abnormal   Collection Time: 12/05/23  2:57 PM   Specimen: Urine, Clean Catch  Result Value Ref Range Status   Specimen Description URINE, CLEAN CATCH  Final   Special Requests   Final    ADDED 2234 12/05/2023 Performed at [facility], [address] [place], [place] [address]    Culture >=100,000 COLONIES/mL ESCHERICHIA COLI (A)  Final   Report Status 12/07/2023 FINAL  Final   Organism ID, Bacteria ESCHERICHIA COLI (A)  Final      Susceptibility   Escherichia coli - MIC*    AMPICILLIN >=32 RESISTANT Resistant     CEFAZOLIN (URINE) Value in next row Sensitive      4 SENSITIVEThis is a modified FDA-approved test that has been validated and its performance characteristics determined by the reporting laboratory.  This laboratory is certified under the Clinical Laboratory Improvement Amendments CLIA as qualified to perform high complexity clinical laboratory testing.    CEFEPIME Value in next row Sensitive      4 SENSITIVEThis is a modified FDA-approved test that has been validated and its performance characteristics determined by the reporting laboratory.  This laboratory is certified under the Clinical Laboratory Improvement Amendments CLIA as qualified to perform high complexity clinical laboratory testing.    ERTAPENEM Value in next row Sensitive      4 SENSITIVEThis is a modified FDA-approved test that has been validated and its performance characteristics determined by the reporting laboratory.  This laboratory is certified under the Clinical  Laboratory Improvement Amendments CLIA as qualified to perform high complexity clinical laboratory testing.    CEFTRIAXONE Value in next row Sensitive      4 SENSITIVEThis is a modified FDA-approved test that has been validated and its performance characteristics determined by the reporting laboratory.  This laboratory is certified under the Clinical Laboratory Improvement Amendments CLIA as qualified to perform high complexity clinical laboratory testing.    CIPROFLOXACIN Value in next row Sensitive      4 SENSITIVEThis is a modified FDA-approved test that has been validated and its performance characteristics determined by the reporting laboratory.  This laboratory is certified under the Clinical Laboratory Improvement Amendments CLIA as qualified to perform high complexity clinical laboratory testing.    GENTAMICIN Value in next row Sensitive      4 SENSITIVEThis is a modified FDA-approved test that has been validated and its performance characteristics determined by the reporting laboratory.  This laboratory is certified under the Clinical Laboratory Improvement Amendments CLIA as qualified to perform high complexity clinical laboratory testing.    NITROFURANTOIN Value in next row Sensitive      4 SENSITIVEThis is a modified FDA-approved test that has been validated and its performance characteristics determined by the reporting laboratory.  This laboratory is certified under the Clinical Laboratory Improvement Amendments CLIA as qualified to perform high complexity clinical laboratory testing.    TRIMETH/SULFA Value in next row Sensitive      4 SENSITIVEThis is a modified FDA-approved test that has been validated and its performance characteristics determined by the reporting laboratory.  This laboratory is certified under the Clinical Laboratory Improvement Amendments CLIA as qualified to perform high complexity clinical laboratory testing.    AMPICILLIN/SULBACTAM Value in next row Intermediate       4 SENSITIVEThis is a modified FDA-approved test that has been validated and its performance characteristics determined by the reporting laboratory.  This laboratory is certified under the Clinical  Laboratory Improvement Amendments CLIA as qualified to perform high complexity clinical laboratory testing.    PIP/TAZO Value in next row Sensitive      <=4 SENSITIVEThis is a modified FDA-approved test that has been validated and its performance characteristics determined by the reporting laboratory.  This laboratory is certified under the Clinical Laboratory Improvement Amendments CLIA as qualified to perform high complexity clinical laboratory testing.    MEROPENEM Value in next row Sensitive      <=4 SENSITIVEThis is a modified FDA-approved test that has been validated and its performance characteristics determined by the reporting laboratory.  This laboratory is certified under the Clinical Laboratory Improvement Amendments CLIA as qualified to perform high complexity clinical laboratory testing.    * >=100,000 COLONIES/mL ESCHERICHIA COLI    FURTHER DISCHARGE INSTRUCTIONS:   Get Medicines reviewed and adjusted: Please take all your medications with you for your next visit with your Primary MD   Laboratory/radiological data: Please request your Primary MD to go over all hospital tests and procedure/radiological results at the follow up, please ask your Primary MD to get all Hospital records sent to his/her office.   In some cases, they will be blood work, cultures and biopsy results pending at the time of your discharge. Please request that your primary care M.D. goes through all the records of your hospital data and follows up on these results.   Also Note the following: If you experience worsening of your admission symptoms, develop shortness of breath, life threatening emergency, suicidal or homicidal thoughts you must seek medical attention immediately by calling 911 or calling your MD  immediately  if symptoms less severe.   You must read complete instructions/literature along with all the possible adverse reactions/side effects for all the Medicines you take and that have been prescribed to you. Take any new Medicines after you have completely understood and accpet all the possible adverse reactions/side effects.    patient was instructed, not to drive, operate heavy machinery, perform activities at heights, swimming or participation in water activities or provide baby-sitting services while on Pain, Sleep and Anxiety Medications; until their outpatient Physician has advised to do so again. Also recommended to not to take more than prescribed Pain, Sleep and Anxiety Medications.  It is not advisable to combine anxiety, sleep and pain medications without talking with your primary care provider.     Wear Seat belts while driving.   Please note: You were cared for by a hospitalist during your hospital stay. Once you are discharged, your primary care physician will handle any further medical issues. Please note that NO REFILLS for any discharge medications will be authorized once you are discharged, as it is imperative that you return to your primary care physician (or establish a relationship with a primary care physician if you do not have one) for your post hospital discharge needs so that they can reassess your need for medications and monitor your lab values  Time coordinating discharge: Over 30 minutes  SIGNED:   Fredia Skeeter, MD  [facility] 12/11/2023, 1:36 PM *Please note that this is a verbal dictation therefore any spelling or grammatical errors are due to the Dragon Medical One system interpretation. If 7PM-7AM, please contact night-coverage [email or web address]

## 2023-12-11 NOTE — Plan of Care (Signed)
  Problem: Education: Goal: Knowledge of General Education information will improve Description: Including pain rating scale, medication(s)/side effects and non-pharmacologic comfort measures Outcome: Progressing   Problem: Clinical Measurements: Goal: Ability to maintain clinical measurements within normal limits will improve Outcome: Progressing Goal: Will remain free from infection Outcome: Progressing   Problem: Education: Goal: Knowledge of disease or condition will improve Outcome: Progressing Goal: Knowledge of secondary prevention will improve (MUST DOCUMENT ALL) Outcome: Progressing Goal: Knowledge of patient specific risk factors will improve (DELETE if not current risk factor) Outcome: Progressing   Problem: Spontaneous Subarachnoid Hemorrhage Tissue Perfusion: Goal: Complications of Spontaneous Subarachnoid Hemorrhage will be minimized Outcome: Progressing   Problem: Self-Care: Goal: Ability to participate in self-care as condition permits will improve Outcome: Progressing

## 2023-12-11 NOTE — Plan of Care (Signed)
 Problem: Education: Goal: Knowledge of General Education information will improve Description: Including pain rating scale, medication(s)/side effects and non-pharmacologic comfort measures 12/11/2023 1609 by Terryl Lauraine LABOR, RN Outcome: Adequate for Discharge 12/11/2023 1609 by Terryl Lauraine LABOR, RN Outcome: Adequate for Discharge   Problem: Health Behavior/Discharge Planning: Goal: Ability to manage health-related needs will improve 12/11/2023 1609 by Terryl Lauraine LABOR, RN Outcome: Adequate for Discharge 12/11/2023 1609 by Terryl Lauraine LABOR, RN Outcome: Adequate for Discharge   Problem: Clinical Measurements: Goal: Ability to maintain clinical measurements within normal limits will improve 12/11/2023 1609 by Terryl Lauraine LABOR, RN Outcome: Adequate for Discharge 12/11/2023 1609 by Terryl Lauraine LABOR, RN Outcome: Adequate for Discharge Goal: Will remain free from infection 12/11/2023 1609 by Terryl Lauraine LABOR, RN Outcome: Adequate for Discharge 12/11/2023 1609 by Terryl Lauraine LABOR, RN Outcome: Adequate for Discharge Goal: Diagnostic test results will improve 12/11/2023 1609 by Terryl Lauraine LABOR, RN Outcome: Adequate for Discharge 12/11/2023 1609 by Terryl Lauraine LABOR, RN Outcome: Adequate for Discharge Goal: Respiratory complications will improve 12/11/2023 1609 by Terryl Lauraine LABOR, RN Outcome: Adequate for Discharge 12/11/2023 1609 by Terryl Lauraine LABOR, RN Outcome: Adequate for Discharge Goal: Cardiovascular complication will be avoided 12/11/2023 1609 by Terryl Lauraine LABOR, RN Outcome: Adequate for Discharge 12/11/2023 1609 by Terryl Lauraine LABOR, RN Outcome: Adequate for Discharge   Problem: Activity: Goal: Risk for activity intolerance will decrease 12/11/2023 1609 by Terryl Lauraine LABOR, RN Outcome: Adequate for Discharge 12/11/2023 1609 by Terryl Lauraine LABOR, RN Outcome: Adequate for Discharge   Problem: Nutrition: Goal: Adequate nutrition will be maintained 12/11/2023 1609 by Terryl Lauraine LABOR, RN Outcome:  Adequate for Discharge 12/11/2023 1609 by Terryl Lauraine LABOR, RN Outcome: Adequate for Discharge   Problem: Coping: Goal: Level of anxiety will decrease 12/11/2023 1609 by Terryl Lauraine LABOR, RN Outcome: Adequate for Discharge 12/11/2023 1609 by Terryl Lauraine LABOR, RN Outcome: Adequate for Discharge   Problem: Elimination: Goal: Will not experience complications related to bowel motility 12/11/2023 1609 by Terryl Lauraine LABOR, RN Outcome: Adequate for Discharge 12/11/2023 1609 by Terryl Lauraine LABOR, RN Outcome: Adequate for Discharge Goal: Will not experience complications related to urinary retention 12/11/2023 1609 by Terryl Lauraine LABOR, RN Outcome: Adequate for Discharge 12/11/2023 1609 by Terryl Lauraine LABOR, RN Outcome: Adequate for Discharge   Problem: Pain Managment: Goal: General experience of comfort will improve and/or be controlled 12/11/2023 1609 by Terryl Lauraine LABOR, RN Outcome: Adequate for Discharge 12/11/2023 1609 by Terryl Lauraine LABOR, RN Outcome: Adequate for Discharge   Problem: Safety: Goal: Ability to remain free from injury will improve 12/11/2023 1609 by Terryl Lauraine LABOR, RN Outcome: Adequate for Discharge 12/11/2023 1609 by Terryl Lauraine LABOR, RN Outcome: Adequate for Discharge   Problem: Skin Integrity: Goal: Risk for impaired skin integrity will decrease 12/11/2023 1609 by Terryl Lauraine LABOR, RN Outcome: Adequate for Discharge 12/11/2023 1609 by Terryl Lauraine LABOR, RN Outcome: Adequate for Discharge   Problem: Education: Goal: Knowledge of disease or condition will improve 12/11/2023 1609 by Terryl Lauraine LABOR, RN Outcome: Adequate for Discharge 12/11/2023 1609 by Terryl Lauraine LABOR, RN Outcome: Adequate for Discharge Goal: Knowledge of secondary prevention will improve (MUST DOCUMENT ALL) 12/11/2023 1609 by Terryl Lauraine LABOR, RN Outcome: Adequate for Discharge 12/11/2023 1609 by Terryl Lauraine LABOR, RN Outcome: Adequate for Discharge Goal: Knowledge of patient specific risk factors will improve  (DELETE if not current risk factor) 12/11/2023 1609 by Terryl Lauraine LABOR, RN Outcome: Adequate for Discharge 12/11/2023 1609 by Terryl Lauraine LABOR, RN Outcome:  Adequate for Discharge   Problem: Spontaneous Subarachnoid Hemorrhage Tissue Perfusion: Goal: Complications of Spontaneous Subarachnoid Hemorrhage will be minimized 12/11/2023 1609 by Terryl Lauraine LABOR, RN Outcome: Adequate for Discharge 12/11/2023 1609 by Terryl Lauraine LABOR, RN Outcome: Adequate for Discharge   Problem: Coping: Goal: Will verbalize positive feelings about self 12/11/2023 1609 by Terryl Lauraine LABOR, RN Outcome: Adequate for Discharge 12/11/2023 1609 by Terryl Lauraine LABOR, RN Outcome: Adequate for Discharge Goal: Will identify appropriate support needs 12/11/2023 1609 by Terryl Lauraine LABOR, RN Outcome: Adequate for Discharge 12/11/2023 1609 by Terryl Lauraine LABOR, RN Outcome: Adequate for Discharge   Problem: Health Behavior/Discharge Planning: Goal: Ability to manage health-related needs will improve 12/11/2023 1609 by Terryl Lauraine LABOR, RN Outcome: Adequate for Discharge 12/11/2023 1609 by Terryl Lauraine LABOR, RN Outcome: Adequate for Discharge Goal: Goals will be collaboratively established with patient/family 12/11/2023 1609 by Terryl Lauraine LABOR, RN Outcome: Adequate for Discharge 12/11/2023 1609 by Terryl Lauraine LABOR, RN Outcome: Adequate for Discharge   Problem: Self-Care: Goal: Ability to participate in self-care as condition permits will improve 12/11/2023 1609 by Terryl Lauraine LABOR, RN Outcome: Adequate for Discharge 12/11/2023 1609 by Terryl Lauraine LABOR, RN Outcome: Adequate for Discharge Goal: Verbalization of feelings and concerns over difficulty with self-care will improve 12/11/2023 1609 by Terryl Lauraine LABOR, RN Outcome: Adequate for Discharge 12/11/2023 1609 by Terryl Lauraine LABOR, RN Outcome: Adequate for Discharge Goal: Ability to communicate needs accurately will improve 12/11/2023 1609 by Terryl Lauraine LABOR, RN Outcome: Adequate for  Discharge 12/11/2023 1609 by Terryl Lauraine LABOR, RN Outcome: Adequate for Discharge   Problem: Nutrition: Goal: Risk of aspiration will decrease 12/11/2023 1609 by Terryl Lauraine LABOR, RN Outcome: Adequate for Discharge 12/11/2023 1609 by Terryl Lauraine LABOR, RN Outcome: Adequate for Discharge Goal: Dietary intake will improve 12/11/2023 1609 by Terryl Lauraine LABOR, RN Outcome: Adequate for Discharge 12/11/2023 1609 by Terryl Lauraine LABOR, RN Outcome: Adequate for Discharge   Problem: Education: Goal: Ability to describe self-care measures that may prevent or decrease complications (Diabetes Survival Skills Education) will improve 12/11/2023 1609 by Terryl Lauraine LABOR, RN Outcome: Adequate for Discharge 12/11/2023 1609 by Terryl Lauraine LABOR, RN Outcome: Adequate for Discharge Goal: Individualized Educational Video(s) 12/11/2023 1609 by Terryl Lauraine LABOR, RN Outcome: Adequate for Discharge 12/11/2023 1609 by Terryl Lauraine LABOR, RN Outcome: Adequate for Discharge   Problem: Coping: Goal: Ability to adjust to condition or change in health will improve 12/11/2023 1609 by Terryl Lauraine LABOR, RN Outcome: Adequate for Discharge 12/11/2023 1609 by Terryl Lauraine LABOR, RN Outcome: Adequate for Discharge   Problem: Fluid Volume: Goal: Ability to maintain a balanced intake and output will improve 12/11/2023 1609 by Terryl Lauraine LABOR, RN Outcome: Adequate for Discharge 12/11/2023 1609 by Terryl Lauraine LABOR, RN Outcome: Adequate for Discharge   Problem: Health Behavior/Discharge Planning: Goal: Ability to identify and utilize available resources and services will improve 12/11/2023 1609 by Terryl Lauraine LABOR, RN Outcome: Adequate for Discharge 12/11/2023 1609 by Terryl Lauraine LABOR, RN Outcome: Adequate for Discharge Goal: Ability to manage health-related needs will improve 12/11/2023 1609 by Terryl Lauraine LABOR, RN Outcome: Adequate for Discharge 12/11/2023 1609 by Terryl Lauraine LABOR, RN Outcome: Adequate for Discharge   Problem:  Metabolic: Goal: Ability to maintain appropriate glucose levels will improve 12/11/2023 1609 by Terryl Lauraine LABOR, RN Outcome: Adequate for Discharge 12/11/2023 1609 by Terryl Lauraine LABOR, RN Outcome: Adequate for Discharge   Problem: Nutritional: Goal: Maintenance of adequate nutrition will improve 12/11/2023 1609 by Terryl Lauraine LABOR, RN  Outcome: Adequate for Discharge 12/11/2023 1609 by Terryl Lauraine LABOR, RN Outcome: Adequate for Discharge Goal: Progress toward achieving an optimal weight will improve 12/11/2023 1609 by Terryl Lauraine LABOR, RN Outcome: Adequate for Discharge 12/11/2023 1609 by Terryl Lauraine LABOR, RN Outcome: Adequate for Discharge   Problem: Skin Integrity: Goal: Risk for impaired skin integrity will decrease 12/11/2023 1609 by Terryl Lauraine LABOR, RN Outcome: Adequate for Discharge 12/11/2023 1609 by Terryl Lauraine LABOR, RN Outcome: Adequate for Discharge   Problem: Tissue Perfusion: Goal: Adequacy of tissue perfusion will improve 12/11/2023 1609 by Terryl Lauraine LABOR, RN Outcome: Adequate for Discharge 12/11/2023 1609 by Terryl Lauraine LABOR, RN Outcome: Adequate for Discharge

## 2024-01-04 ENCOUNTER — Ambulatory Visit: Admitting: Family Medicine

## 2024-01-04 ENCOUNTER — Other Ambulatory Visit (HOSPITAL_COMMUNITY): Payer: Self-pay

## 2024-01-04 ENCOUNTER — Encounter: Payer: Self-pay | Admitting: Family Medicine

## 2024-01-04 VITALS — BP 124/84 | HR 63 | Wt 109.2 lb

## 2024-01-04 DIAGNOSIS — Z8679 Personal history of other diseases of the circulatory system: Secondary | ICD-10-CM | POA: Diagnosis not present

## 2024-01-04 DIAGNOSIS — Z794 Long term (current) use of insulin: Secondary | ICD-10-CM

## 2024-01-04 MED ORDER — METFORMIN HCL 1000 MG PO TABS
1000.0000 mg | ORAL_TABLET | Freq: Two times a day (BID) | ORAL | 1 refills | Status: AC
  Filled 2024-01-04 – 2024-02-29 (×2): qty 180, 90d supply, fill #0

## 2024-01-04 MED ORDER — INSULIN NPH (HUMAN) (ISOPHANE) 100 UNIT/ML ~~LOC~~ SUSP
25.0000 [IU] | Freq: Two times a day (BID) | SUBCUTANEOUS | 0 refills | Status: AC
  Filled 2024-01-04 – 2024-02-29 (×2): qty 10, 20d supply, fill #0

## 2024-01-04 MED ORDER — EMPAGLIFLOZIN 10 MG PO TABS
10.0000 mg | ORAL_TABLET | Freq: Every day | ORAL | 3 refills | Status: DC
  Filled 2024-01-04: qty 90, 90d supply, fill #0

## 2024-01-04 NOTE — Patient Instructions (Signed)
 Please do 30 units of insulin  at night  Please do 25 units of insulin  in the morning  Please start your metformin 1000 morning and evening with food  Please start your jardiance, take 1 pill a day.

## 2024-01-04 NOTE — Progress Notes (Signed)
 Name: Kimberly Fry   Date of Visit: 01/04/24   Date of last visit with me: Visit date not found   CHIEF COMPLAINT:  Chief Complaint  Patient presents with   [facility] follow up. Est care. Sometimes gets pressure in chest, worries about vision not able to see well. Has SOB at times and its hard for her to catch her breath        HPI:  Discussed the use of AI scribe software for clinical note transcription with the patient, who gave verbal consent to proceed.  History of Present Illness   Kimberly Fry is a 58 year old female with diabetes who presents with dizziness and neck pain.  She experiences dizziness and neck pain, which began upon waking. The neck pain radiates downwards and is intermittent, causing significant discomfort and reluctance to get out of bed.  She has diabetes and monitors her blood sugar levels regularly. Last night, her blood sugar was 535 mg/dL, although she was asymptomatic at that time. This morning, her blood sugar was 103 mg/dL. She notes fluctuations in her blood sugar, sometimes reaching 330 mg/dL, particularly when she has difficulty sleeping. Her son administers her insulin , with a regimen of 30 units at night and 25 units in the morning.  She was previously prescribed Keppra  for seizures following a hospital stay but discontinued it, believing it was for nausea. She feels tired and confused.  No symptoms of nausea.         OBJECTIVE:       01/04/2024    2:48 PM  Depression screen PHQ 2/9  Decreased Interest 0  Down, Depressed, Hopeless 1  PHQ - 2 Score 1  Altered sleeping 1  Tired, decreased energy 1  Change in appetite 1  Feeling bad or failure about yourself  1  Trouble concentrating 1  Moving slowly or fidgety/restless 1  Suicidal thoughts 0  PHQ-9 Score 7     BP Readings from Last 3 Encounters:  01/04/24 124/84  12/11/23 104/67  06/19/22 130/77    BP 124/84   Pulse 63   Wt 109 lb 3.2  oz (49.5 kg)   SpO2 97%   BMI 18.74 kg/m    Physical Exam          Physical Exam Constitutional:      Appearance: Normal appearance.  Neurological:     General: No focal deficit present.     Mental Status: She is alert and oriented to person, place, and time. Mental status is at baseline.     ASSESSMENT/PLAN:   Assessment & Plan Hx of subarachnoid hemorrhage  Encounter for long-term (current) use of insulin  (HCC)    Assessment and Plan    Type 2 diabetes mellitus with severe hyperglycemia and associated fatigue, confusion, and dizziness Severe hyperglycemia with blood glucose at 535 mg/dL. Current insulin  regimen insufficient. Fatigue and confusion likely due to hyperglycemia. - Added metformin: one pill in the morning and one pill in the evening. - Added Jardiance: one pill daily. - Adjusted insulin  regimen: 30 units at night and 25 units in the morning, did discuss appropriate sugar ranges with patient.  - Scheduled follow-up in three weeks to assess blood glucose levels.  Generalized pain after recent hospitalization Generalized pain likely residual from recent hospitalization. Expected to improve over time. - Monitor pain and expect gradual improvement.         Kimberly Fry A. Vita MD [facility]

## 2024-01-12 NOTE — Telephone Encounter (Signed)
 Patient is scheduled and aware.

## 2024-01-12 NOTE — Telephone Encounter (Signed)
-----   Message from DeBary sent at 01/12/2024 11:29 AM EST ----- Please schedule phaco OD topical then two weeks later phaco OS topical. She will need a B scan at work up and Dr. KANDICE would like to be at work up appt. Thank you!

## 2024-01-13 ENCOUNTER — Other Ambulatory Visit (HOSPITAL_COMMUNITY): Payer: Self-pay

## 2024-01-25 ENCOUNTER — Ambulatory Visit (INDEPENDENT_AMBULATORY_CARE_PROVIDER_SITE_OTHER): Admitting: Family Medicine

## 2024-01-25 ENCOUNTER — Encounter: Payer: Self-pay | Admitting: Family Medicine

## 2024-01-25 VITALS — BP 118/78 | HR 71 | Wt 111.8 lb

## 2024-01-25 DIAGNOSIS — E1165 Type 2 diabetes mellitus with hyperglycemia: Secondary | ICD-10-CM | POA: Diagnosis not present

## 2024-01-25 DIAGNOSIS — Z794 Long term (current) use of insulin: Secondary | ICD-10-CM | POA: Diagnosis not present

## 2024-01-25 MED ORDER — ATORVASTATIN CALCIUM 40 MG PO TABS: 40.0000 mg | ORAL_TABLET | Freq: Every day | ORAL | 0 refills | Status: AC

## 2024-01-25 MED ORDER — EMPAGLIFLOZIN 10 MG PO TABS: 10.0000 mg | ORAL_TABLET | Freq: Every day | ORAL | 3 refills | Status: AC

## 2024-01-25 NOTE — Progress Notes (Signed)
" ° °  Name: Kimberly Fry   Date of Visit: 01/25/2024   Date of last visit with me: 01/04/2024   CHIEF COMPLAINT:  Chief Complaint  Patient presents with   Follow-up    3 week follow up. Neck pain is no longer there not as bad anymore. Thinks she is doing good, the fatigue is getting better, sleeping through the night without having to get up to use the bathroom.        HPI:  Discussed the use of AI scribe software for clinical note transcription with the patient, who gave verbal consent to proceed.  History of Present Illness   Kimberly Fry is a 58 year old female with diabetes who presents for follow-up regarding her medication management.  She feels better overall, noting improvements in her sleep and no longer needing to get up at night.  She is not currently taking Jardiance , a medication prescribed during her last visit, because she has not picked it up from the pharmacy. She is unsure if her insurance covers it and has not been able to work due to blurry vision, which has impacted her ability to afford medications. Additionally, her son is undergoing dialysis, which has further complicated her situation.  She recalls having adverse reactions to a previous diabetes medication prescribed by another doctor, which caused stomach pain and a feeling of being unwell.  Her current medication regimen includes an albuterol  inhaler as needed, alprazolam as needed, atorvastatin  40 mg for cholesterol, Wellbutrin XL 300 mg, carvedilol 6.25 mg twice a day, clozapine as needed, Flexeril, losartan 25 mg, Zofran  for nausea, pantoprazole , sertraline 200 mg, and vitamin D supplementation.         OBJECTIVE:       01/04/2024    2:48 PM  Depression screen PHQ 2/9  Decreased Interest 0  Down, Depressed, Hopeless 1  PHQ - 2 Score 1  Altered sleeping 1  Tired, decreased energy 1  Change in appetite 1  Feeling bad or failure about yourself  1  Trouble concentrating 1   Moving slowly or fidgety/restless 1  Suicidal thoughts 0  PHQ-9 Score 7     BP Readings from Last 3 Encounters:  01/25/24 118/78  01/04/24 124/84  12/11/23 104/67    BP 118/78   Pulse 71   Wt 111 lb 12.8 oz (50.7 kg)   SpO2 98%   BMI 19.19 kg/m    Physical Exam          Physical Exam Constitutional:      Appearance: Normal appearance.  Neurological:     General: No focal deficit present.     Mental Status: She is alert and oriented to person, place, and time. Mental status is at baseline.     ASSESSMENT/PLAN:   Assessment & Plan Type 2 diabetes mellitus with hyperglycemia, without long-term current use of insulin  (HCC)  Encounter for long-term (current) use of insulin  (HCC)    Assessment and Plan    Type 2 diabetes mellitus with hyperglycemia Reports improvement in symptoms. Not taking Jardiance  due to insurance uncertainty and personal circumstances. - Sent Jardiance  prescription to [facility] on [address]. - Instructed her to visit the pharmacy to check coverage and cost of Jardiance . - Advised her to pick up Jardiance  if covered by insurance. - Scheduled follow-up in three months.         Carter Kassel A. Vita MD [facility] "

## 2024-02-02 ENCOUNTER — Ambulatory Visit: Payer: Self-pay

## 2024-02-09 ENCOUNTER — Telehealth: Payer: Self-pay

## 2024-02-09 NOTE — Progress Notes (Signed)
 Care Guide Pharmacy Note  02/09/2024 Name: Fayrene Towner Bria Sparr MRN: [id number] DOB: September 26, 1965  Referred By: Vita Morrow, MD Reason for referral: Complex Care Management (Outreach to schedule initial referral with PharmD)   [name] Auriana Scalia is a 59 y.o. year old female who is a primary care patient of Vita Morrow, MD.  Shasta Jesus Zafiro Routson was referred to the pharmacist for assistance related to: DMII  An unsuccessful telephone outreach was attempted today to contact the patient who was referred to the pharmacy team for assistance with medication management. Additional attempts will be made to contact the patient.  Debbe Fuse [facility], [facility] Guide  Direct Dial: [phone]  Fax [phone]

## 2024-02-11 NOTE — Progress Notes (Unsigned)
 Care Guide Pharmacy Note  02/11/2024 Name: [name] [name] MRN: [id number] DOB: [birth year]  Referred By: Vita Morrow, MD Reason for referral: Complex Care Management (Outreach to schedule initial referral with PharmD)   [name] [name] is a 59 y.o. year old female who is a primary care patient of Vita Morrow, MD.  [name] was referred to the pharmacist for assistance related to: DMII  A second unsuccessful telephone outreach was attempted today to contact the patient who was referred to the pharmacy team for assistance with medication management. Additional attempts will be made to contact the patient.  Debbe Fuse [facility], [facility] Guide  Direct Dial: [phone]  Fax [phone]

## 2024-02-14 NOTE — Progress Notes (Signed)
 Care Guide Pharmacy Note  02/14/2024 Name: Ainsleigh Kakos Lucendia Leard MRN: [id number] DOB: [birth year]  Referred By: Vita Morrow, MD Reason for referral: Complex Care Management (Outreach to schedule initial referral with PharmD)   [name] Clemie General is a 59 y.o. year old female who is a primary care patient of Vita Morrow, MD.  Shasta Jesus Fynn Adel was referred to the pharmacist for assistance related to: DMII  A third unsuccessful telephone outreach was attempted today to contact the patient who was referred to the pharmacy team for assistance with medication management. The Population Health team is pleased to engage with this patient at any time in the future upon receipt of referral and should he/she be interested in assistance from the [facility] team. No further outreach attempts will be made.   Debbe Fuse [facility], [facility] - Inpatient Guide  Direct Dial: [phone]  Fax [phone]

## 2024-02-29 ENCOUNTER — Other Ambulatory Visit (HOSPITAL_COMMUNITY): Payer: Self-pay

## 2024-03-13 ENCOUNTER — Ambulatory Visit

## 2024-04-25 ENCOUNTER — Ambulatory Visit: Admitting: Family Medicine
